# Patient Record
Sex: Female | Born: 1945 | Race: White | Hispanic: No | Marital: Married | Smoking: Former smoker
Health system: Southern US, Community
[De-identification: ages and names within clinical notes are randomized; demographics above are authoritative.]

## PROBLEM LIST (undated history)

## (undated) DIAGNOSIS — S32010A Wedge compression fracture of first lumbar vertebra, initial encounter for closed fracture: Secondary | ICD-10-CM

## (undated) DIAGNOSIS — E559 Vitamin D deficiency, unspecified: Secondary | ICD-10-CM

## (undated) DIAGNOSIS — I10 Essential (primary) hypertension: Secondary | ICD-10-CM

## (undated) DIAGNOSIS — J309 Allergic rhinitis, unspecified: Secondary | ICD-10-CM

## (undated) DIAGNOSIS — J45909 Unspecified asthma, uncomplicated: Secondary | ICD-10-CM

## (undated) DIAGNOSIS — M8580 Other specified disorders of bone density and structure, unspecified site: Secondary | ICD-10-CM

## (undated) DIAGNOSIS — J449 Chronic obstructive pulmonary disease, unspecified: Secondary | ICD-10-CM

## (undated) DIAGNOSIS — C4491 Basal cell carcinoma of skin, unspecified: Secondary | ICD-10-CM

## (undated) DIAGNOSIS — E7800 Pure hypercholesterolemia, unspecified: Secondary | ICD-10-CM

## (undated) DIAGNOSIS — J439 Emphysema, unspecified: Secondary | ICD-10-CM

## (undated) HISTORY — PX: EYE SURGERY: SHX253

## (undated) HISTORY — PX: PILONIDAL CYST EXCISION: SHX744

## (undated) HISTORY — PX: ABDOMINAL HYSTERECTOMY: SHX81

## (undated) HISTORY — PX: BUNIONECTOMY: SHX129

---

## 2006-08-03 ENCOUNTER — Inpatient Hospital Stay: Payer: Self-pay | Admitting: Internal Medicine

## 2006-08-03 ENCOUNTER — Other Ambulatory Visit: Payer: Self-pay

## 2012-06-22 ENCOUNTER — Ambulatory Visit: Payer: Self-pay | Admitting: Unknown Physician Specialty

## 2012-06-23 LAB — PATHOLOGY REPORT

## 2012-10-09 ENCOUNTER — Ambulatory Visit: Payer: Self-pay | Admitting: Internal Medicine

## 2012-10-23 ENCOUNTER — Ambulatory Visit: Payer: Self-pay | Admitting: Family Medicine

## 2012-10-23 LAB — RAPID INFLUENZA A&B ANTIGENS

## 2014-08-23 IMAGING — CR DG CHEST 2V
1 series · 2 of 2 positions shown · non-contrast
Comparison: none

REASON FOR EXAM: productive cough X 5 days
COMMENTS:

[Series 1: pa · 0.17mm/px · 2 of 2 slices shown]
[im 1/2]
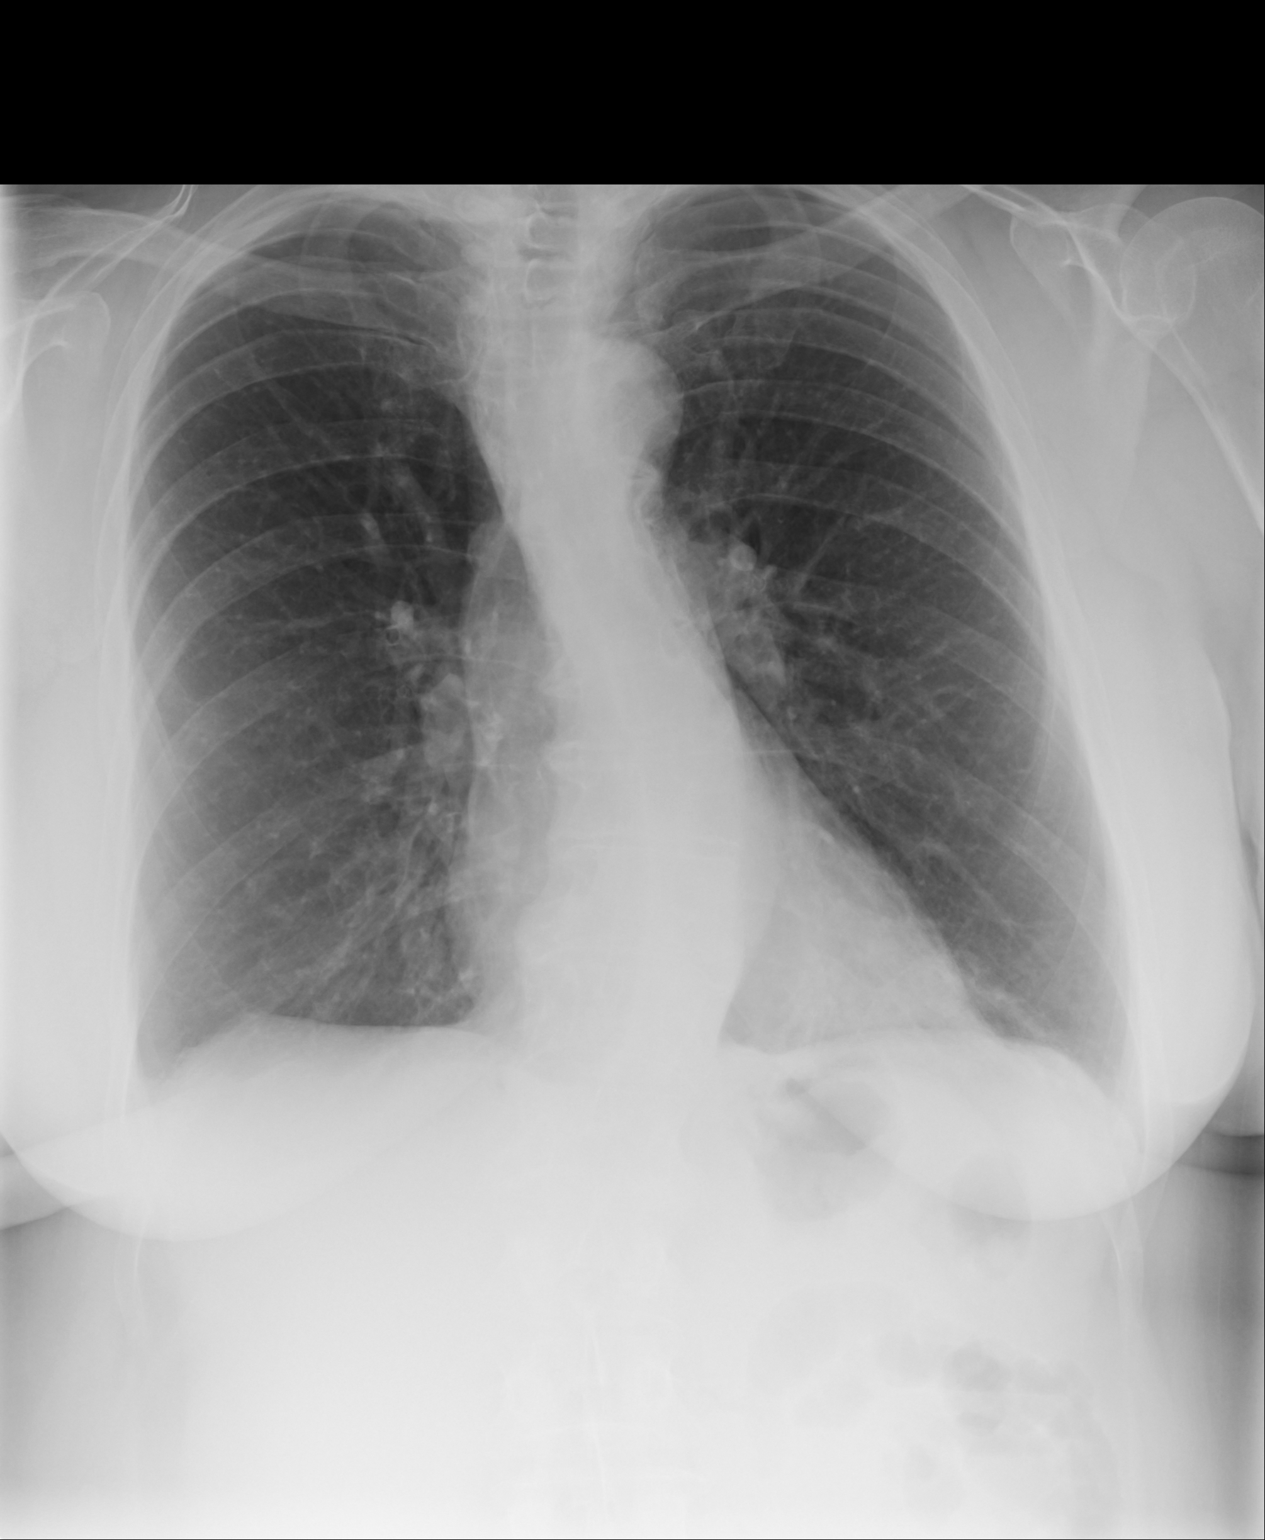
[im 2/2]
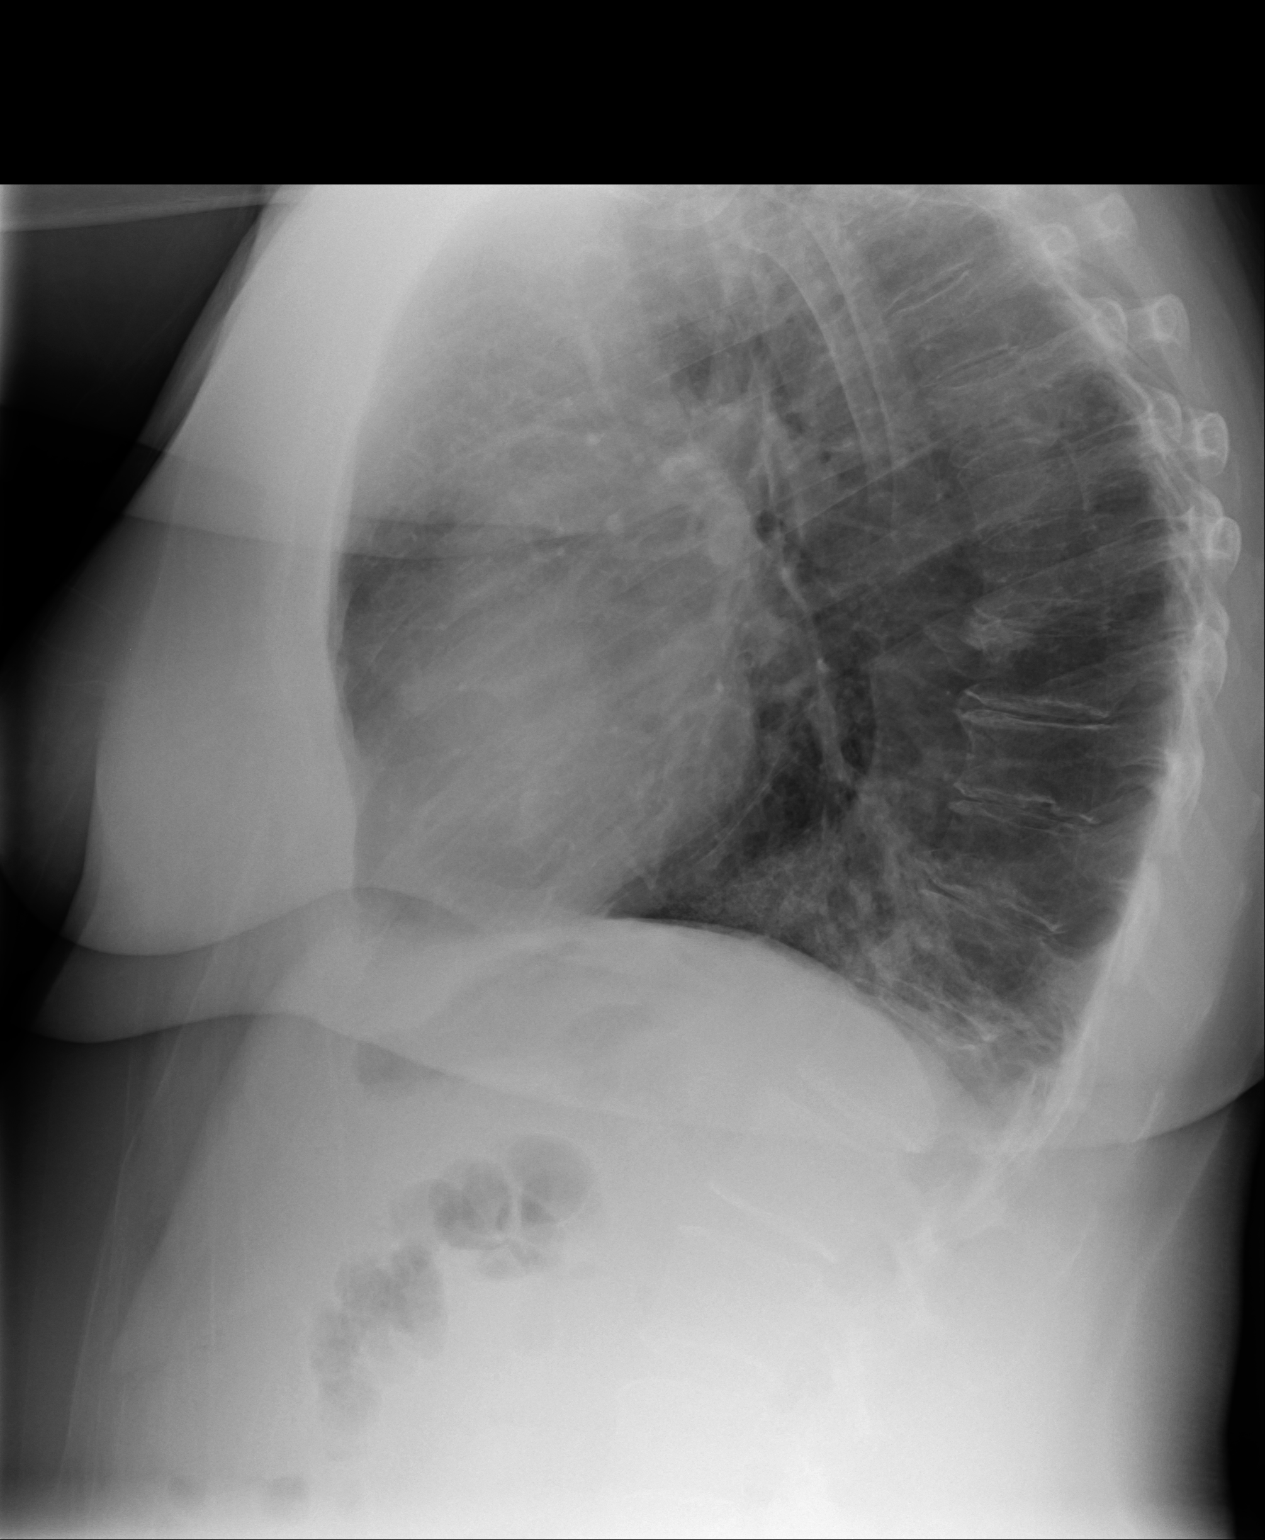

[2 of 2 positions shown; findings below may reference images not displayed]

PROCEDURE:     MDR - MDR CHEST PA(OR AP) AND LATERAL  - October 09, 2012  [DATE]

RESULT:     The lungs are mildly hyperinflated. There are increased lung
markings at the left lung base posteriorly consistent with pneumonia. The
cardiac silhouette is normal in size. The pulmonary vascularity is not
engorged. There is tortuosity of the descending thoracic aorta. There is
mild midthoracic scoliosis convex to the left.
IMPRESSION: The findings are consistent with COPD and left lower lobe
pneumonia.

[REDACTED]

## 2017-01-11 ENCOUNTER — Ambulatory Visit
Admission: EM | Admit: 2017-01-11 | Discharge: 2017-01-11 | Disposition: A | Discharge status: Home / Self Care | Payer: Medicare Other | Attending: Family Medicine | Admitting: Family Medicine

## 2017-01-11 ENCOUNTER — Encounter: Payer: Self-pay | Admitting: Gynecology

## 2017-01-11 DIAGNOSIS — R059 Cough, unspecified: Secondary | ICD-10-CM | POA: Diagnosis present

## 2017-01-11 DIAGNOSIS — R05 Cough: Secondary | ICD-10-CM | POA: Diagnosis not present

## 2017-01-11 DIAGNOSIS — J0100 Acute maxillary sinusitis, unspecified: Secondary | ICD-10-CM | POA: Diagnosis present

## 2017-01-11 HISTORY — DX: Other specified disorders of bone density and structure, unspecified site: M85.80

## 2017-01-11 HISTORY — DX: Essential (primary) hypertension: I10

## 2017-01-11 HISTORY — DX: Basal cell carcinoma of skin, unspecified: C44.91

## 2017-01-11 HISTORY — DX: Pure hypercholesterolemia, unspecified: E78.00

## 2017-01-11 HISTORY — DX: Vitamin D deficiency, unspecified: E55.9

## 2017-01-11 HISTORY — DX: Chronic obstructive pulmonary disease, unspecified: J44.9

## 2017-01-11 HISTORY — DX: Allergic rhinitis, unspecified: J30.9

## 2017-01-11 MED ORDER — AMOXICILLIN-POT CLAVULANATE 875-125 MG PO TABS: 1.0000 | ORAL_TABLET | Freq: Two times a day (BID) | ORAL | 0 refills | Status: AC

## 2017-01-11 NOTE — ED Provider Notes (Signed)
CSN: [id number]     Arrival date & time 01/11/17  0827 History   None    Chief Complaint  Patient presents with  . Facial Pain  . Cough   (Consider location/radiation/quality/duration/timing/severity/associated sxs/prior Treatment) The history is provided by the patient. No language interpreter was used.  Cough  Cough characteristics:  Productive Sputum characteristics:  Yellow Severity:  Mild Onset quality:  Sudden Timing:  Constant Progression:  Worsening Chronicity:  Recurrent Smoker: no   Context: upper respiratory infection     Past Medical History:  Diagnosis Date  . Allergic rhinitis   . Basal cell carcinoma   . COPD (chronic obstructive pulmonary disease) ([place])   . Hypercholesteremia   . Hypertension   . Osteopenia   . Vitamin D deficiency    Past Surgical History:  Procedure Laterality Date  . ABDOMINAL HYSTERECTOMY    . BUNIONECTOMY    . PILONIDAL CYST EXCISION     No family history on file. Social History  Substance Use Topics  . Smoking status: Former Smoker    Packs/day: 1.00    Types: Cigarettes    Quit date: 03/25/2007  . Smokeless tobacco: Never Used  . Alcohol use Yes   OB History    No data available     Review of Systems  HENT: Positive for congestion, sinus pain and sinus pressure.   Eyes: Negative.   Respiratory: Positive for cough.   Cardiovascular: Negative.   Gastrointestinal: Negative.   Endocrine: Negative.   Genitourinary: Negative.   Musculoskeletal: Negative.   Skin: Negative.   Allergic/Immunologic: Negative.   Psychiatric/Behavioral: Negative.   All other systems reviewed and are negative.   Allergies  Lisinopril  Home Medications   Prior to Admission medications   Medication Sig Start Date End Date Taking? Authorizing Provider  albuterol (PROVENTIL HFA;VENTOLIN HFA) 108 (90 Base) MCG/ACT inhaler Inhale into the lungs every 6 (six) hours as needed for wheezing or shortness of breath.   Yes [provider]   amLODipine (NORVASC) 10 MG tablet Take 10 mg by mouth daily.   Yes [provider]  budesonide-formoterol (SYMBICORT) 80-4.5 MCG/ACT inhaler Inhale 2 puffs into the lungs 2 (two) times daily.   Yes [provider]  cetirizine (ZYRTEC) 10 MG tablet Take 10 mg by mouth daily.   Yes [provider]  cholecalciferol (VITAMIN D) 400 units TABS tablet Take 2,000 Units by mouth.   Yes [provider]  losartan-hydrochlorothiazide (HYZAAR) 100-25 MG tablet Take 1 tablet by mouth daily.   Yes [provider]  triamcinolone (NASACORT) 55 MCG/ACT AERO nasal inhaler Place 2 sprays into the nose daily.   Yes [provider]  amoxicillin-clavulanate (AUGMENTIN) 875-125 MG tablet Take 1 tablet by mouth every 12 (twelve) hours. 7/0/78 6/75/44  Cephus Tupy, Jeanett Schlein, NP   Meds Ordered and Administered this Visit  Medications - No data to display  BP 133/73 (BP Location: Left Arm)   Pulse 92   Temp 98.3 F (36.8 C) (Oral)   Resp 16   Ht 5\' 3"  (1.6 m)   Wt 188 lb (85.3 kg)   SpO2 93%   BMI 33.30 kg/m  No data found.   Physical Exam  Constitutional: She is oriented to person, place, and time. She appears well-developed and well-nourished. She is active and cooperative. No distress.  HENT:  Head: Normocephalic.  Right Ear: Tympanic membrane is retracted.  Left Ear: Tympanic membrane is retracted.  Nose: Mucosal edema present. Right sinus exhibits maxillary sinus  tenderness. Left sinus exhibits maxillary sinus tenderness.  Mouth/Throat: Uvula is midline, oropharynx is clear and moist and mucous membranes are normal.  Eyes: Conjunctivae, EOM and lids are normal. Pupils are equal, round, and reactive to light.  Neck: Normal range of motion. No tracheal deviation present.  Cardiovascular: Regular rhythm, normal heart sounds and normal pulses.   No murmur heard. Pulmonary/Chest: Effort normal and breath sounds normal.  Abdominal: Soft. Bowel sounds are  normal. There is no tenderness.  Musculoskeletal: Normal range of motion.  Lymphadenopathy:    She has no cervical adenopathy.  Neurological: She is alert and oriented to person, place, and time. GCS eye subscore is 4. GCS verbal subscore is 5. GCS motor subscore is 6.  Skin: Skin is warm and dry. No rash noted.  Psychiatric: She has a normal mood and affect. Her speech is normal and behavior is normal.  Nursing note and vitals reviewed.   Urgent Care Course     Procedures (including critical care time)  Labs Review Labs Reviewed - No data to display  Imaging Review No results found.       MDM   1. Acute non-recurrent maxillary sinusitis   2. Cough     Take Augmentin until completed, continue home meds. Rest,push fluids. Follow up with PCP as needed. Return to  UC as needed.    Tori Milks, NP 80/22/33 [phone]

## 2017-01-11 NOTE — ED Triage Notes (Signed)
Per patient x couple days with sinus headache / persistent cough and would like an antibiotic.

## 2017-01-11 NOTE — Discharge Instructions (Signed)
Take Augmentin until completed, continue home meds. Rest,push fluids. Follow up with PCP as needed. Return to  UC as needed.

## 2017-03-18 ENCOUNTER — Other Ambulatory Visit: Payer: Self-pay | Admitting: Family Medicine

## 2017-03-18 DIAGNOSIS — M8580 Other specified disorders of bone density and structure, unspecified site: Secondary | ICD-10-CM

## 2017-03-18 DIAGNOSIS — M899 Disorder of bone, unspecified: Secondary | ICD-10-CM

## 2018-01-14 ENCOUNTER — Encounter: Payer: Self-pay | Admitting: *Deleted

## 2018-01-15 ENCOUNTER — Encounter: Payer: Self-pay | Admitting: *Deleted

## 2018-01-15 ENCOUNTER — Encounter
Admission: RE | Disposition: A | Discharge status: Home / Self Care | Payer: Self-pay | Source: Ambulatory Visit | Attending: Unknown Physician Specialty

## 2018-01-15 ENCOUNTER — Ambulatory Visit
Admission: RE | Admit: 2018-01-15 | Discharge: 2018-01-15 | Disposition: A | Discharge status: Home / Self Care | Payer: Medicare Other | Source: Ambulatory Visit | Attending: Unknown Physician Specialty | Admitting: Unknown Physician Specialty

## 2018-01-15 ENCOUNTER — Ambulatory Visit: Payer: Medicare Other | Admitting: Anesthesiology

## 2018-01-15 DIAGNOSIS — D122 Benign neoplasm of ascending colon: Secondary | ICD-10-CM | POA: Diagnosis not present

## 2018-01-15 DIAGNOSIS — Z85828 Personal history of other malignant neoplasm of skin: Secondary | ICD-10-CM | POA: Insufficient documentation

## 2018-01-15 DIAGNOSIS — I10 Essential (primary) hypertension: Secondary | ICD-10-CM | POA: Diagnosis not present

## 2018-01-15 DIAGNOSIS — M8580 Other specified disorders of bone density and structure, unspecified site: Secondary | ICD-10-CM | POA: Diagnosis not present

## 2018-01-15 DIAGNOSIS — K635 Polyp of colon: Secondary | ICD-10-CM | POA: Insufficient documentation

## 2018-01-15 DIAGNOSIS — Z86010 Personal history of colonic polyps: Secondary | ICD-10-CM | POA: Diagnosis present

## 2018-01-15 DIAGNOSIS — D127 Benign neoplasm of rectosigmoid junction: Secondary | ICD-10-CM | POA: Insufficient documentation

## 2018-01-15 DIAGNOSIS — Z888 Allergy status to other drugs, medicaments and biological substances status: Secondary | ICD-10-CM | POA: Diagnosis not present

## 2018-01-15 DIAGNOSIS — Z7951 Long term (current) use of inhaled steroids: Secondary | ICD-10-CM | POA: Diagnosis not present

## 2018-01-15 DIAGNOSIS — Z87891 Personal history of nicotine dependence: Secondary | ICD-10-CM | POA: Diagnosis not present

## 2018-01-15 DIAGNOSIS — E7800 Pure hypercholesterolemia, unspecified: Secondary | ICD-10-CM | POA: Insufficient documentation

## 2018-01-15 DIAGNOSIS — Z79899 Other long term (current) drug therapy: Secondary | ICD-10-CM | POA: Insufficient documentation

## 2018-01-15 DIAGNOSIS — E559 Vitamin D deficiency, unspecified: Secondary | ICD-10-CM | POA: Diagnosis not present

## 2018-01-15 DIAGNOSIS — Z1211 Encounter for screening for malignant neoplasm of colon: Secondary | ICD-10-CM | POA: Insufficient documentation

## 2018-01-15 DIAGNOSIS — J449 Chronic obstructive pulmonary disease, unspecified: Secondary | ICD-10-CM | POA: Diagnosis not present

## 2018-01-15 HISTORY — PX: COLONOSCOPY WITH PROPOFOL: SHX5780

## 2018-01-15 SURGERY — COLONOSCOPY WITH PROPOFOL
Anesthesia: General

## 2018-01-15 MED ORDER — PROPOFOL 500 MG/50ML IV EMUL
INTRAVENOUS | Status: DC | PRN
  Administered 2018-01-15: 125 ug/kg/min via INTRAVENOUS

## 2018-01-15 MED ORDER — PROPOFOL 10 MG/ML IV BOLUS
INTRAVENOUS | Status: DC | PRN
  Administered 2018-01-15: 20 mg via INTRAVENOUS
  Administered 2018-01-15: 50 mg via INTRAVENOUS
  Administered 2018-01-15: 20 mg via INTRAVENOUS
  Administered 2018-01-15: 30 mg via INTRAVENOUS

## 2018-01-15 MED ORDER — SODIUM CHLORIDE 0.9 % IV SOLN
INTRAVENOUS | Status: DC
  Administered 2018-01-15: 1000 mL via INTRAVENOUS

## 2018-01-15 MED ORDER — PROPOFOL 500 MG/50ML IV EMUL
INTRAVENOUS | Status: AC
  Filled 2018-01-15: qty 50

## 2018-01-15 MED ORDER — SODIUM CHLORIDE 0.9 % IV SOLN
INTRAVENOUS | Status: DC
  Administered 2018-01-15: 11:00:00 via INTRAVENOUS

## 2018-01-15 NOTE — Anesthesia Post-op Follow-up Note (Signed)
Anesthesia QCDR form completed.        

## 2018-01-15 NOTE — Transfer of Care (Signed)
Immediate Anesthesia Transfer of Care Note  Patient: Emily Lawrence  Procedure(s) Performed: COLONOSCOPY WITH PROPOFOL (N/A )  Patient Location: PACU and Endoscopy Unit  Anesthesia Type:General  Level of Consciousness: awake, alert  and oriented  Airway & Oxygen Therapy: Patient Spontanous Breathing and Patient connected to nasal cannula oxygen  Post-op Assessment: Report given to RN and Post -op Vital signs reviewed and stable  Post vital signs: Reviewed and stable  Last Vitals:  Vitals Value Taken Time  BP    Temp    Pulse    Resp    SpO2      Last Pain: There were no vitals filed for this visit.       Complications: No apparent anesthesia complications

## 2018-01-15 NOTE — Op Note (Signed)
[facility] Gastroenterology Patient Name: Emily Lawrence Procedure Date: 01/15/2018 11:05 AM MRN: [id number] Account #: [id number] Date of Birth: [birth year] Admit Type: Outpatient Age: 72 Room: [facility] ENDO ROOM 1 Gender: Female Note Status: Finalized Procedure:            Colonoscopy Indications:          High risk colon cancer surveillance: Personal history                        of colonic polyps Providers:            Manya Silvas, MD Referring MD:         Gayland Curry MD, MD (Referring MD) Medicines:            Propofol per Anesthesia Complications:        No immediate complications. Procedure:            Pre-Anesthesia Assessment:                       - After reviewing the risks and benefits, the patient                        was deemed in satisfactory condition to undergo the                        procedure.                       After obtaining informed consent, the colonoscope was                        passed under direct vision. Throughout the procedure,                        the patient's blood pressure, pulse, and oxygen                        saturations were monitored continuously. The                        Colonoscope was introduced through the anus and                        advanced to the the cecum, identified by appendiceal                        orifice and ileocecal valve. The colonoscopy was                        somewhat difficult due to a tortuous colon. Successful                        completion of the procedure was aided by applying                        abdominal pressure. The patient tolerated the procedure                        well. Findings:      A small polyp was found in the proximal ascending colon. The polyp was  sessile. The polyp was removed with a hot snare. Resection and retrieval       were complete.      A diminutive polyp was found in the sigmoid colon. The polyp was       sessile. The polyp was removed  with a jumbo cold forceps. Resection and       retrieval were complete.      A diminutive polyp was found in the recto-sigmoid colon. The polyp was       sessile. The polyp was removed with a jumbo cold forceps. Resection and       retrieval were complete.      A small polyp was found in the recto-sigmoid colon. The polyp was       sessile. The polyp was removed with a hot snare. Resection and retrieval       were complete.      Four sessile polyps were found in the recto-sigmoid colon. The polyps       were diminutive in size. These polyps were removed with a jumbo cold       forceps. Resection and retrieval were complete. Impression:           - One small polyp in the proximal ascending colon,                        removed with a hot snare. Resected and retrieved.                       - One diminutive polyp in the sigmoid colon, removed                        with a jumbo cold forceps. Resected and retrieved.                       - One diminutive polyp at the recto-sigmoid colon,                        removed with a jumbo cold forceps. Resected and                        retrieved.                       - One small polyp at the recto-sigmoid colon, removed                        with a hot snare. Resected and retrieved.                       - Four diminutive polyps at the recto-sigmoid colon,                        removed with a jumbo cold forceps. Resected and                        retrieved. Recommendation:       - Await pathology results. Manya Silvas, MD 01/15/2018 12:02:49 PM This report has been signed electronically. Number of Addenda: 0 Note Initiated On: 01/15/2018 11:05 AM Scope Withdrawal Time: 0 hours 26 minutes 54 seconds  Total Procedure Duration: 0 hours 39 minutes 13 seconds  [facility]

## 2018-01-15 NOTE — H&P (Signed)
Primary Care Physician:  Gayland Curry, MD Primary Gastroenterologist:  Dr. Vira Agar  Pre-Procedure History & Physical: HPI:  Emily Lawrence is a 72 y.o. female is here for an colonoscopy.  Done for personal history of colon polyps.   Past Medical History:  Diagnosis Date  . Allergic rhinitis   . Basal cell carcinoma   . COPD (chronic obstructive pulmonary disease) ([place])   . Hypercholesteremia   . Hypertension   . Osteopenia   . Vitamin D deficiency     Past Surgical History:  Procedure Laterality Date  . ABDOMINAL HYSTERECTOMY    . BUNIONECTOMY    . PILONIDAL CYST EXCISION      Prior to Admission medications   Medication Sig Start Date End Date Taking? Authorizing Provider  albuterol (PROVENTIL HFA;VENTOLIN HFA) 108 (90 Base) MCG/ACT inhaler Inhale into the lungs every 6 (six) hours as needed for wheezing or shortness of breath.   Yes [provider]  amLODipine (NORVASC) 10 MG tablet Take 10 mg by mouth daily.   Yes [provider]  budesonide-formoterol (SYMBICORT) 80-4.5 MCG/ACT inhaler Inhale 2 puffs into the lungs 2 (two) times daily.   Yes [provider]  cetirizine (ZYRTEC) 10 MG tablet Take 10 mg by mouth daily.   Yes [provider]  cholecalciferol (VITAMIN D) 400 units TABS tablet Take 2,000 Units by mouth.   Yes [provider]  losartan-hydrochlorothiazide (HYZAAR) 100-25 MG tablet Take 1 tablet by mouth daily.   Yes [provider]  triamcinolone (NASACORT) 55 MCG/ACT AERO nasal inhaler Place 2 sprays into the nose daily.   Yes [provider]    Allergies as of 11/04/2017 - Review Complete 01/11/2017  Allergen Reaction Noted  . Lisinopril Cough 01/11/2017    History reviewed. No pertinent family history.  Social History   Socioeconomic History  . Marital status: Married    Spouse name: Not on file  . Number of children: Not on file  . Years of education: Not on file  . Highest  education level: Not on file  Occupational History  . Not on file  Social Needs  . Financial resource strain: Not on file  . Food insecurity:    Worry: Not on file    Inability: Not on file  . Transportation needs:    Medical: Not on file    Non-medical: Not on file  Tobacco Use  . Smoking status: Former Smoker    Packs/day: 1.00    Types: Cigarettes    Last attempt to quit: 03/25/2007    Years since quitting: 10.8  . Smokeless tobacco: Never Used  Substance and Sexual Activity  . Alcohol use: Yes  . Drug use: No  . Sexual activity: Not on file  Lifestyle  . Physical activity:    Days per week: Not on file    Minutes per session: Not on file  . Stress: Not on file  Relationships  . Social connections:    Talks on phone: Not on file    Gets together: Not on file    Attends religious service: Not on file    Active member of club or organization: Not on file    Attends meetings of clubs or organizations: Not on file    Relationship status: Not on file  . Intimate partner violence:    Fear of current or ex partner: Not on file    Emotionally abused: Not on file    Physically abused: Not on file  Forced sexual activity: Not on file  Other Topics Concern  . Not on file  Social History Narrative  . Not on file    Review of Systems: See HPI, otherwise negative ROS  Physical Exam: There were no vitals taken for this visit. General:   Alert,  pleasant and cooperative in NAD Head:  Normocephalic and atraumatic. Neck:  Supple; no masses or thyromegaly. Lungs:  Clear throughout to auscultation.    Heart:  Regular rate and rhythm. Abdomen:  Soft, nontender and nondistended. Normal bowel sounds, without guarding, and without rebound.   Neurologic:  Alert and  oriented x4;  grossly normal neurologically.  Impression/Plan: Emily Lawrence is here for an colonoscopy to be performed for [facility] colon polyps.  Risks, benefits, limitations, and alternatives regarding  colonoscopy  have been reviewed with the patient.  Questions have been answered.  All parties agreeable.   Gaylyn Cheers, MD  01/15/2018, 11:10 AM

## 2018-01-15 NOTE — Anesthesia Preprocedure Evaluation (Signed)
Anesthesia Evaluation  Patient identified by MRN, date of birth, ID band Patient awake    Reviewed: Allergy & Precautions, H&P , NPO status , Patient's Chart, lab work & pertinent test results, reviewed documented beta blocker date and time   Airway Mallampati: III  TM Distance: >3 FB Neck ROM: full    Dental  (+) Caps, Dental Advidsory Given, Teeth Intact   Pulmonary neg shortness of breath, COPD, neg recent URI, former smoker,           Cardiovascular Exercise Tolerance: Good hypertension, (-) angina(-) CAD, (-) Past MI, (-) Cardiac Stents and (-) CABG (-) dysrhythmias (-) Valvular Problems/Murmurs     Neuro/Psych negative neurological ROS  negative psych ROS   GI/Hepatic negative GI ROS, Neg liver ROS,   Endo/Other  negative endocrine ROS  Renal/GU negative Renal ROS  negative genitourinary   Musculoskeletal   Abdominal   Peds  Hematology negative hematology ROS (+)   Anesthesia Other Findings Past Medical History: No date: Allergic rhinitis No date: Basal cell carcinoma No date: COPD (chronic obstructive pulmonary disease) ([facility]) No date: Hypercholesteremia No date: Hypertension No date: Osteopenia No date: Vitamin D deficiency   Reproductive/Obstetrics negative OB ROS                             Anesthesia Physical Anesthesia Plan  ASA: II  Anesthesia Plan: General   Post-op Pain Management:    Induction: Intravenous  PONV Risk Score and Plan: 3 and Propofol infusion and TIVA  Airway Management Planned: Nasal Cannula  Additional Equipment:   Intra-op Plan:   Post-operative Plan:   Informed Consent: I have reviewed the patients History and Physical, chart, labs and discussed the procedure including the risks, benefits and alternatives for the proposed anesthesia with the patient or authorized representative who has indicated his/her understanding and acceptance.    Dental Advisory Given  Plan Discussed with: Anesthesiologist, CRNA and Surgeon  Anesthesia Plan Comments:         Anesthesia Quick Evaluation

## 2018-01-16 NOTE — Anesthesia Postprocedure Evaluation (Signed)
Anesthesia Post Note  Patient: Emily Lawrence  Procedure(s) Performed: COLONOSCOPY WITH PROPOFOL (N/A )  Patient location during evaluation: Endoscopy Anesthesia Type: General Level of consciousness: awake and alert Pain management: pain level controlled Vital Signs Assessment: post-procedure vital signs reviewed and stable Respiratory status: spontaneous breathing, nonlabored ventilation, respiratory function stable and patient connected to nasal cannula oxygen Cardiovascular status: blood pressure returned to baseline and stable Postop Assessment: no apparent nausea or vomiting Anesthetic complications: no     Last Vitals:  Vitals:   01/15/18 1220 01/15/18 1230  BP: 125/76 128/78  Pulse: 72 72  Resp: 18 (!) 21  Temp:    SpO2: 99% 98%    Last Pain:  Vitals:   01/15/18 1230  TempSrc:   PainSc: 0-No pain                 Martha Clan

## 2018-01-17 ENCOUNTER — Encounter: Payer: Self-pay | Admitting: Unknown Physician Specialty

## 2018-01-19 LAB — SURGICAL PATHOLOGY

## 2019-12-21 ENCOUNTER — Other Ambulatory Visit: Payer: Self-pay | Admitting: Family Medicine

## 2019-12-21 DIAGNOSIS — Z1231 Encounter for screening mammogram for malignant neoplasm of breast: Secondary | ICD-10-CM

## 2020-01-04 ENCOUNTER — Other Ambulatory Visit: Payer: Self-pay | Admitting: Family Medicine

## 2020-01-04 DIAGNOSIS — Z122 Encounter for screening for malignant neoplasm of respiratory organs: Secondary | ICD-10-CM

## 2020-01-04 DIAGNOSIS — Z87891 Personal history of nicotine dependence: Secondary | ICD-10-CM

## 2020-01-19 ENCOUNTER — Telehealth: Payer: Self-pay | Admitting: *Deleted

## 2020-01-19 NOTE — Telephone Encounter (Signed)
Received referral for low dose lung cancer screening CT scan. Message left at phone number listed in EMR for patient to call me back to facilitate scheduling scan.  

## 2020-01-24 ENCOUNTER — Telehealth: Payer: Self-pay | Admitting: *Deleted

## 2020-01-24 DIAGNOSIS — Z87891 Personal history of nicotine dependence: Secondary | ICD-10-CM

## 2020-01-24 DIAGNOSIS — Z122 Encounter for screening for malignant neoplasm of respiratory organs: Secondary | ICD-10-CM

## 2020-01-24 NOTE — Telephone Encounter (Signed)
Received referral for initial lung cancer screening scan. Contacted patient and obtained smoking history,(former, quit 14 years ago, 32 pack year) as well as answering questions related to screening process. Patient denies signs of lung cancer such as weight loss or hemoptysis. Patient denies comorbidity that would prevent curative treatment if lung cancer were found. Patient is scheduled for shared decision making visit and CT scan on 03/06/20 at 115pm.

## 2020-03-06 ENCOUNTER — Inpatient Hospital Stay: Payer: Medicare Other | Attending: Nurse Practitioner | Admitting: Nurse Practitioner

## 2020-03-06 ENCOUNTER — Other Ambulatory Visit: Payer: Self-pay

## 2020-03-06 ENCOUNTER — Ambulatory Visit
Admission: RE | Admit: 2020-03-06 | Discharge: 2020-03-06 | Disposition: A | Discharge status: Home / Self Care | Payer: Medicare Other | Source: Ambulatory Visit | Attending: Nurse Practitioner | Admitting: Nurse Practitioner

## 2020-03-06 DIAGNOSIS — Z122 Encounter for screening for malignant neoplasm of respiratory organs: Secondary | ICD-10-CM | POA: Diagnosis present

## 2020-03-06 DIAGNOSIS — Z87891 Personal history of nicotine dependence: Secondary | ICD-10-CM

## 2020-03-06 NOTE — Progress Notes (Signed)
Virtual Visit via Video Enabled Telemedicine Note   I connected with Emily Lawrence on 03/06/20 at 1:15 PM EST by video enabled telemedicine visit and verified that I am speaking with the correct person using two identifiers.   I discussed the limitations, risks, security and privacy concerns of performing an evaluation and management service by telemedicine and the availability of in-person appointments. I also discussed with the patient that there may be a patient responsible charge related to this service. The patient expressed understanding and agreed to proceed.   Other persons participating in the visit and their role in the encounter: Burgess Estelle, RN- checking in patient & navigation  Patient's location: [place]  Provider's location: Clinic  Chief Complaint: Low Dose CT Screening  Patient agreed to evaluation by telemedicine to discuss shared decision making for consideration of low dose CT lung cancer screening.    In accordance with CMS guidelines, patient has met eligibility criteria including age, absence of signs or symptoms of lung cancer.  Social History   Tobacco Use  . Smoking status: Former Smoker    Packs/day: 1.00    Types: Cigarettes    Quit date: 03/25/2007    Years since quitting: 12.9  . Smokeless tobacco: Never Used  Substance Use Topics  . Alcohol use: Yes     A shared decision-making session was conducted prior to the performance of CT scan. This includes one or more decision aids, includes benefits and harms of screening, follow-up diagnostic testing, over-diagnosis, false positive rate, and total radiation exposure.   Counseling on the importance of adherence to annual lung cancer LDCT screening, impact of co-morbidities, and ability or willingness to undergo diagnosis and treatment is imperative for compliance of the program.   Counseling on the importance of continued smoking cessation for former smokers; the importance of smoking cessation for  current smokers, and information about tobacco cessation interventions have been given to patient including [place] and [address] [employer] [place] programs.   Written order for lung cancer screening with LDCT has been given to the patient and any and all questions have been answered to the best of my abilities.    Yearly follow up will be coordinated by Burgess Estelle, Thoracic Navigator.  I discussed the assessment and treatment plan with the patient. The patient was provided an opportunity to ask questions and all were answered. The patient agreed with the plan and demonstrated an understanding of the instructions.   The patient was advised to call back or seek an in-person evaluation if the symptoms worsen or if the condition fails to improve as anticipated.   I provided 15 minutes of face-to-face video visit time during this encounter, and > 50% was spent counseling as documented under my assessment & plan.   Beckey Rutter, DNP, AGNP-C [place] [facility] [phone] (clinic)

## 2020-03-15 ENCOUNTER — Telehealth: Payer: Self-pay | Admitting: *Deleted

## 2020-03-15 NOTE — Telephone Encounter (Signed)
Notified patient of LDCT lung cancer screening program results with recommendation for 6 month follow up imaging. Also notified of incidental findings noted below and is encouraged to discuss further with PCP who will receive a copy of this note and/or the CT report. Patient verbalizes understanding.   IMPRESSION: 1. Lung-RADS 3, probably benign findings. Short-term follow-up in 6 months is recommended with repeat low-dose chest CT without contrast (please use the following order, "CT CHEST LCS NODULE FOLLOW-UP W/O CM"). Mild bronchiectasis with bronchial wall thickening and scattered airway impaction in the right lower lobe has associated small nodules measuring up to 6.5 mm. This is probably related to atypical infection although in the appropriate clinical setting, aspiration could have a similar appearance. Consider therapy prior to follow-up CT. 2. Small to moderate hiatal hernia. 3. Emphysema (ICD10-J43.9).

## 2020-07-16 ENCOUNTER — Other Ambulatory Visit (HOSPITAL_COMMUNITY): Payer: Self-pay | Admitting: Specialist

## 2020-07-16 ENCOUNTER — Other Ambulatory Visit: Payer: Self-pay | Admitting: Specialist

## 2020-07-16 DIAGNOSIS — R9389 Abnormal findings on diagnostic imaging of other specified body structures: Secondary | ICD-10-CM

## 2020-07-16 DIAGNOSIS — R918 Other nonspecific abnormal finding of lung field: Secondary | ICD-10-CM

## 2020-09-04 ENCOUNTER — Ambulatory Visit
Admission: RE | Admit: 2020-09-04 | Discharge: 2020-09-04 | Disposition: A | Discharge status: Home / Self Care | Payer: Medicare Other | Source: Ambulatory Visit | Attending: Specialist | Admitting: Specialist

## 2020-09-04 ENCOUNTER — Other Ambulatory Visit: Payer: Self-pay | Admitting: Specialist

## 2020-09-04 ENCOUNTER — Other Ambulatory Visit: Payer: Self-pay

## 2020-09-04 DIAGNOSIS — R918 Other nonspecific abnormal finding of lung field: Secondary | ICD-10-CM | POA: Diagnosis present

## 2020-09-04 DIAGNOSIS — R9389 Abnormal findings on diagnostic imaging of other specified body structures: Secondary | ICD-10-CM

## 2020-09-04 DIAGNOSIS — Z87891 Personal history of nicotine dependence: Secondary | ICD-10-CM | POA: Insufficient documentation

## 2020-09-11 ENCOUNTER — Other Ambulatory Visit: Payer: Self-pay | Admitting: Family Medicine

## 2020-09-11 DIAGNOSIS — R109 Unspecified abdominal pain: Secondary | ICD-10-CM

## 2020-09-13 ENCOUNTER — Ambulatory Visit: Payer: Medicare Other

## 2020-12-06 ENCOUNTER — Encounter: Payer: Self-pay | Admitting: Ophthalmology

## 2020-12-10 ENCOUNTER — Telehealth: Payer: Self-pay | Admitting: *Deleted

## 2020-12-10 ENCOUNTER — Other Ambulatory Visit: Payer: Self-pay | Admitting: *Deleted

## 2020-12-10 DIAGNOSIS — Z87891 Personal history of nicotine dependence: Secondary | ICD-10-CM

## 2020-12-10 DIAGNOSIS — R918 Other nonspecific abnormal finding of lung field: Secondary | ICD-10-CM

## 2020-12-10 NOTE — Telephone Encounter (Signed)
Attempted to contact and schedule lung screening scan follow up imaging. Message left for patient to call back to schedule.

## 2020-12-10 NOTE — Progress Notes (Signed)
Contacted and scheduled for LCS nodule follow up. Former smoker, quit 15 years ago, 32 pack year. Delay in follow up per patient request.

## 2020-12-18 ENCOUNTER — Ambulatory Visit: Payer: Medicare Other | Admitting: Anesthesiology

## 2020-12-18 ENCOUNTER — Ambulatory Visit
Admission: RE | Admit: 2020-12-18 | Discharge: 2020-12-18 | Disposition: A | Discharge status: Home / Self Care | Payer: Medicare Other | Attending: Ophthalmology | Admitting: Ophthalmology

## 2020-12-18 ENCOUNTER — Other Ambulatory Visit: Payer: Self-pay

## 2020-12-18 ENCOUNTER — Encounter
Admission: RE | Disposition: A | Discharge status: Home / Self Care | Payer: Self-pay | Source: Home / Self Care | Attending: Ophthalmology

## 2020-12-18 DIAGNOSIS — Z7951 Long term (current) use of inhaled steroids: Secondary | ICD-10-CM | POA: Insufficient documentation

## 2020-12-18 DIAGNOSIS — H2512 Age-related nuclear cataract, left eye: Secondary | ICD-10-CM | POA: Diagnosis present

## 2020-12-18 DIAGNOSIS — Z90710 Acquired absence of both cervix and uterus: Secondary | ICD-10-CM | POA: Diagnosis not present

## 2020-12-18 DIAGNOSIS — Z888 Allergy status to other drugs, medicaments and biological substances status: Secondary | ICD-10-CM | POA: Insufficient documentation

## 2020-12-18 DIAGNOSIS — Z85828 Personal history of other malignant neoplasm of skin: Secondary | ICD-10-CM | POA: Diagnosis not present

## 2020-12-18 DIAGNOSIS — Z87891 Personal history of nicotine dependence: Secondary | ICD-10-CM | POA: Insufficient documentation

## 2020-12-18 DIAGNOSIS — Z79899 Other long term (current) drug therapy: Secondary | ICD-10-CM | POA: Insufficient documentation

## 2020-12-18 HISTORY — PX: CATARACT EXTRACTION W/PHACO: SHX586

## 2020-12-18 SURGERY — PHACOEMULSIFICATION, CATARACT, WITH IOL INSERTION
Anesthesia: Monitor Anesthesia Care | Site: Eye | Laterality: Left

## 2020-12-18 MED ORDER — MIDAZOLAM HCL 2 MG/2ML IJ SOLN
INTRAMUSCULAR | Status: DC | PRN
  Administered 2020-12-18: 2 mg via INTRAVENOUS

## 2020-12-18 MED ORDER — LIDOCAINE HCL (PF) 2 % IJ SOLN
INTRAOCULAR | Status: DC | PRN
  Administered 2020-12-18: 1 mL via INTRAMUSCULAR

## 2020-12-18 MED ORDER — MOXIFLOXACIN HCL 0.5 % OP SOLN
OPHTHALMIC | Status: DC | PRN
  Administered 2020-12-18: 0.2 mL via OPHTHALMIC

## 2020-12-18 MED ORDER — ARMC OPHTHALMIC DILATING DROPS
1.0000 "application " | OPHTHALMIC | Status: DC | PRN
  Administered 2020-12-18 (×3): 1 via OPHTHALMIC

## 2020-12-18 MED ORDER — BRIMONIDINE TARTRATE-TIMOLOL 0.2-0.5 % OP SOLN
OPHTHALMIC | Status: DC | PRN
  Administered 2020-12-18: 1 [drp] via OPHTHALMIC

## 2020-12-18 MED ORDER — TETRACAINE HCL 0.5 % OP SOLN
1.0000 [drp] | OPHTHALMIC | Status: DC | PRN
  Administered 2020-12-18 (×3): 1 [drp] via OPHTHALMIC

## 2020-12-18 MED ORDER — FENTANYL CITRATE (PF) 100 MCG/2ML IJ SOLN
INTRAMUSCULAR | Status: DC | PRN
  Administered 2020-12-18: 50 ug via INTRAVENOUS

## 2020-12-18 MED ORDER — EPINEPHRINE PF 1 MG/ML IJ SOLN
INTRAOCULAR | Status: DC | PRN
  Administered 2020-12-18: 66 mL via OPHTHALMIC

## 2020-12-18 MED ORDER — LACTATED RINGERS IV SOLN: INTRAVENOUS | Status: DC

## 2020-12-18 MED ORDER — NA CHONDROIT SULF-NA HYALURON 40-17 MG/ML IO SOLN
INTRAOCULAR | Status: DC | PRN
  Administered 2020-12-18: 1 mL via INTRAOCULAR

## 2020-12-18 SURGICAL SUPPLY — 17 items
CANNULA ANT/CHMB 27GA (MISCELLANEOUS) ×6 IMPLANT
GLOVE SURG TRIUMPH 8.0 PF LTX (GLOVE) ×3 IMPLANT
GOWN STRL REUS W/ TWL LRG LVL3 (GOWN DISPOSABLE) ×2 IMPLANT
GOWN STRL REUS W/TWL LRG LVL3 (GOWN DISPOSABLE) ×6
LENS IOL TECNIS EYHANCE 20.5 (Intraocular Lens) ×3 IMPLANT
MARKER SKIN DUAL TIP RULER LAB (MISCELLANEOUS) ×3 IMPLANT
NEEDLE FILTER BLUNT 18X 1/2SAF (NEEDLE) ×2
NEEDLE FILTER BLUNT 18X1 1/2 (NEEDLE) ×1 IMPLANT
PACK EYE AFTER SURG (MISCELLANEOUS) ×3 IMPLANT
PACK OPTHALMIC (MISCELLANEOUS) ×3 IMPLANT
PACK PORFILIO (MISCELLANEOUS) ×3 IMPLANT
SUT ETHILON 10-0 CS-B-6CS-B-6 (SUTURE)
SUTURE EHLN 10-0 CS-B-6CS-B-6 (SUTURE) IMPLANT
SYR 3ML LL SCALE MARK (SYRINGE) ×3 IMPLANT
SYR TB 1ML LUER SLIP (SYRINGE) ×3 IMPLANT
WATER STERILE IRR 250ML POUR (IV SOLUTION) ×3 IMPLANT
WIPE NON LINTING 3.25X3.25 (MISCELLANEOUS) ×3 IMPLANT

## 2020-12-18 NOTE — Anesthesia Procedure Notes (Signed)
Procedure Name: MAC Date/Time: 12/18/2020 11:38 AM Performed by: Mayme Genta, CRNA Pre-anesthesia Checklist: Patient identified, Emergency Drugs available, Suction available, Timeout performed and Patient being monitored Patient Re-evaluated:Patient Re-evaluated prior to induction Oxygen Delivery Method: Nasal cannula Placement Confirmation: positive ETCO2

## 2020-12-18 NOTE — Op Note (Signed)
PREOPERATIVE DIAGNOSIS:  Nuclear sclerotic cataract of the left eye.   POSTOPERATIVE DIAGNOSIS:  Nuclear sclerotic cataract of the left eye.   OPERATIVE PROCEDURE:ORPROCALL@   SURGEON:  Birder Robson, MD.   ANESTHESIA:  Anesthesiologist: Alisa Graff, MD CRNA: Mayme Genta, CRNA  1.      Managed anesthesia care. 2.     0.71ml of Shugarcaine was instilled following the paracentesis   COMPLICATIONS:  None.   TECHNIQUE:   Stop and chop   DESCRIPTION OF PROCEDURE:  The patient was examined and consented in the preoperative holding area where the aforementioned topical anesthesia was applied to the left eye and then brought back to the Operating Room where the left eye was prepped and draped in the usual sterile ophthalmic fashion and a lid speculum was placed. A paracentesis was created with the side port blade and the anterior chamber was filled with viscoelastic. A near clear corneal incision was performed with the steel keratome. A continuous curvilinear capsulorrhexis was performed with a cystotome followed by the capsulorrhexis forceps. Hydrodissection and hydrodelineation were carried out with BSS on a blunt cannula. The lens was removed in a stop and chop  technique and the remaining cortical material was removed with the irrigation-aspiration handpiece. The capsular bag was inflated with viscoelastic and the Technis [id number] lens was placed in the capsular bag without complication. The remaining viscoelastic was removed from the eye with the irrigation-aspiration handpiece. The wounds were hydrated. The anterior chamber was flushed with BSS and the eye was inflated to physiologic pressure. 0.41ml Vigamox was placed in the anterior chamber. The wounds were found to be water tight. The eye was dressed with Combigan. The patient was given protective glasses to wear throughout the day and a shield with which to sleep tonight. The patient was also given drops with which to begin a drop regimen  today and will follow-up with me in one day. Implant Name Type Inv. Item Serial No. Manufacturer Lot No. LRB No. Used Action  LENS IOL TECNIS EYHANCE 20.5 - [id number] Intraocular Lens LENS IOL TECNIS EYHANCE 20.5 [id number] JOHNSON   Left 1 Implanted    Procedure(s) with comments: CATARACT EXTRACTION PHACO AND INTRAOCULAR LENS PLACEMENT (IOC) LEFT (Left) - 12.95 01:09.0  Electronically signed: Birder Robson 12/18/2020 11:50 AM

## 2020-12-18 NOTE — Anesthesia Postprocedure Evaluation (Signed)
Anesthesia Post Note  Patient: Emily Lawrence  Procedure(s) Performed: CATARACT EXTRACTION PHACO AND INTRAOCULAR LENS PLACEMENT (IOC) LEFT (Left: Eye)     Patient location during evaluation: PACU Anesthesia Type: MAC Level of consciousness: awake and alert Pain management: pain level controlled Vital Signs Assessment: post-procedure vital signs reviewed and stable Respiratory status: spontaneous breathing, nonlabored ventilation, respiratory function stable and patient connected to nasal cannula oxygen Cardiovascular status: stable and blood pressure returned to baseline Postop Assessment: no apparent nausea or vomiting Anesthetic complications: no   No notable events documented.  Alisa Graff

## 2020-12-18 NOTE — H&P (Signed)
[facility]   Primary Care Physician:  Gayland Curry, MD Ophthalmologist: Dr. George Ina  Pre-Procedure History & Physical: HPI:  Emily Lawrence is a 75 y.o. female here for cataract surgery.   Past Medical History:  Diagnosis Date   Allergic rhinitis    Basal cell carcinoma    COPD (chronic obstructive pulmonary disease) ([place])    Hypercholesteremia    Hypertension    Osteopenia    Vitamin D deficiency     Past Surgical History:  Procedure Laterality Date   ABDOMINAL HYSTERECTOMY     BUNIONECTOMY     COLONOSCOPY WITH PROPOFOL N/A 01/15/2018   Procedure: COLONOSCOPY WITH PROPOFOL;  Surgeon: Manya Silvas, MD;  Location: [facility] ENDOSCOPY;  Service: Endoscopy;  Laterality: N/A;   PILONIDAL CYST EXCISION      Prior to Admission medications   Medication Sig Start Date End Date Taking? Authorizing Provider  albuterol (PROVENTIL HFA;VENTOLIN HFA) 108 (90 Base) MCG/ACT inhaler Inhale into the lungs every 6 (six) hours as needed for wheezing or shortness of breath.   Yes [provider]  amLODipine (NORVASC) 10 MG tablet Take 10 mg by mouth daily.   Yes [provider]  atorvastatin (LIPITOR) 10 MG tablet Take 10 mg by mouth daily.   Yes [provider]  budesonide-formoterol (SYMBICORT) 80-4.5 MCG/ACT inhaler Inhale 2 puffs into the lungs 2 (two) times daily.   Yes [provider]  Carboxymethylcellulose Sodium (THERATEARS OP) Apply to eye 2 (two) times daily as needed.   Yes [provider]  cetirizine (ZYRTEC) 10 MG tablet Take 10 mg by mouth daily.   Yes [provider]  cholecalciferol (VITAMIN D) 400 units TABS tablet Take 2,000 Units by mouth.   Yes [provider]  hydrochlorothiazide (HYDRODIURIL) 25 MG tablet Take 25 mg by mouth daily.   Yes [provider]  losartan (COZAAR) 100 MG tablet Take 100 mg by mouth daily.   Yes [provider]  triamcinolone (NASACORT) 55 MCG/ACT AERO nasal  inhaler Place 2 sprays into the nose daily.   Yes [provider]    Allergies as of 11/12/2020 - Review Complete 01/15/2018  Allergen Reaction Noted   Lisinopril Cough 01/11/2017    History reviewed. No pertinent family history.  Social History   Socioeconomic History   Marital status: Married    Spouse name: Not on file   Number of children: Not on file   Years of education: Not on file   Highest education level: Not on file  Occupational History   Not on file  Tobacco Use   Smoking status: Former    Packs/day: 1.00    Pack years: 0.00    Types: Cigarettes    Quit date: 03/25/2007    Years since quitting: 13.7   Smokeless tobacco: Never  Vaping Use   Vaping Use: Never used  Substance and Sexual Activity   Alcohol use: Yes    Comment: occasional   Drug use: No   Sexual activity: Not on file  Other Topics Concern   Not on file  Social History Narrative   Not on file   Social Determinants of Health   Financial Resource Strain: Not on file  Food Insecurity: Not on file  Transportation Needs: Not on file  Physical Activity: Not on file  Stress: Not on file  Social Connections: Not on file  Intimate Partner Violence: Not on file    Review of Systems: See HPI, otherwise negative ROS  Physical  Exam: BP (!) 145/89   Pulse 89   Temp 98.3 F (36.8 C) (Temporal)   Resp 16   Ht 5\' 5"  (1.651 m)   Wt 88 kg   SpO2 97%   BMI 32.28 kg/m  General:   Alert,  pleasant and cooperative in NAD Head:  Normocephalic and atraumatic. Respiratory:  Normal work of breathing. Cardiovascular:  RRR  Impression/Plan: Emily Lawrence is here for cataract surgery.  Risks, benefits, limitations, and alternatives regarding cataract surgery have been reviewed with the patient.  Questions have been answered.  All parties agreeable.   Birder Robson, MD  12/18/2020, 11:28 AM

## 2020-12-18 NOTE — Anesthesia Preprocedure Evaluation (Signed)
Anesthesia Evaluation  Patient identified by MRN, date of birth, ID band Patient awake    Reviewed: Allergy & Precautions, H&P , NPO status , Patient's Chart, lab work & pertinent test results, reviewed documented beta blocker date and time   Airway Mallampati: II  TM Distance: >3 FB Neck ROM: full    Dental no notable dental hx.    Pulmonary COPD, former smoker,    Pulmonary exam normal breath sounds clear to auscultation       Cardiovascular Exercise Tolerance: Good hypertension, negative cardio ROS   Rhythm:regular Rate:Normal     Neuro/Psych negative neurological ROS  negative psych ROS   GI/Hepatic negative GI ROS, Neg liver ROS,   Endo/Other  negative endocrine ROS  Renal/GU negative Renal ROS  negative genitourinary   Musculoskeletal   Abdominal   Peds  Hematology negative hematology ROS (+)   Anesthesia Other Findings   Reproductive/Obstetrics negative OB ROS                             Anesthesia Physical Anesthesia Plan  ASA: 2  Anesthesia Plan: MAC   Post-op Pain Management:    Induction:   PONV Risk Score and Plan: 2 and Treatment may vary due to age or medical condition  Airway Management Planned:   Additional Equipment:   Intra-op Plan:   Post-operative Plan:   Informed Consent: I have reviewed the patients History and Physical, chart, labs and discussed the procedure including the risks, benefits and alternatives for the proposed anesthesia with the patient or authorized representative who has indicated his/her understanding and acceptance.     Dental Advisory Given  Plan Discussed with: CRNA  Anesthesia Plan Comments:         Anesthesia Quick Evaluation

## 2020-12-18 NOTE — Transfer of Care (Signed)
Immediate Anesthesia Transfer of Care Note  Patient: Emily Lawrence  Procedure(s) Performed: CATARACT EXTRACTION PHACO AND INTRAOCULAR LENS PLACEMENT (IOC) LEFT (Left: Eye)  Patient Location: PACU  Anesthesia Type: MAC  Level of Consciousness: awake, alert  and patient cooperative  Airway and Oxygen Therapy: Patient Spontanous Breathing and Patient connected to supplemental oxygen  Post-op Assessment: Post-op Vital signs reviewed, Patient's Cardiovascular Status Stable, Respiratory Function Stable, Patent Airway and No signs of Nausea or vomiting  Post-op Vital Signs: Reviewed and stable  Complications: No notable events documented.

## 2020-12-19 ENCOUNTER — Other Ambulatory Visit: Payer: Self-pay

## 2020-12-19 ENCOUNTER — Encounter: Payer: Self-pay | Admitting: Ophthalmology

## 2020-12-25 NOTE — Anesthesia Preprocedure Evaluation (Addendum)
Anesthesia Evaluation  Patient identified by MRN, date of birth, ID band Patient awake    Reviewed: Allergy & Precautions, NPO status , Patient's Chart, lab work & pertinent test results  History of Anesthesia Complications Negative for: history of anesthetic complications  Airway Mallampati: IV   Neck ROM: Full    Dental no notable dental hx.    Pulmonary COPD, former smoker (quit 2008),    Pulmonary exam normal breath sounds clear to auscultation       Cardiovascular hypertension, Normal cardiovascular exam Rhythm:Regular Rate:Normal     Neuro/Psych    GI/Hepatic negative GI ROS,   Endo/Other  Obesity   Renal/GU negative Renal ROS     Musculoskeletal   Abdominal   Peds  Hematology Skin BCC   Anesthesia Other Findings   Reproductive/Obstetrics                            Anesthesia Physical Anesthesia Plan  ASA: 2  Anesthesia Plan: MAC   Post-op Pain Management:    Induction: Intravenous  PONV Risk Score and Plan: 2 and TIVA, Treatment may vary due to age or medical condition and Midazolam  Airway Management Planned: Nasal Cannula  Additional Equipment:   Intra-op Plan:   Post-operative Plan:   Informed Consent: I have reviewed the patients History and Physical, chart, labs and discussed the procedure including the risks, benefits and alternatives for the proposed anesthesia with the patient or authorized representative who has indicated his/her understanding and acceptance.       Plan Discussed with: CRNA  Anesthesia Plan Comments:        Anesthesia Quick Evaluation

## 2021-01-01 ENCOUNTER — Ambulatory Visit: Payer: Medicare Other | Admitting: Anesthesiology

## 2021-01-01 ENCOUNTER — Ambulatory Visit
Admission: RE | Admit: 2021-01-01 | Discharge: 2021-01-01 | Disposition: A | Discharge status: Home / Self Care | Payer: Medicare Other | Attending: Ophthalmology | Admitting: Ophthalmology

## 2021-01-01 ENCOUNTER — Other Ambulatory Visit: Payer: Self-pay

## 2021-01-01 ENCOUNTER — Encounter: Payer: Self-pay | Admitting: Ophthalmology

## 2021-01-01 ENCOUNTER — Encounter
Admission: RE | Disposition: A | Discharge status: Home / Self Care | Payer: Self-pay | Source: Home / Self Care | Attending: Ophthalmology

## 2021-01-01 DIAGNOSIS — Z85828 Personal history of other malignant neoplasm of skin: Secondary | ICD-10-CM | POA: Diagnosis not present

## 2021-01-01 DIAGNOSIS — Z888 Allergy status to other drugs, medicaments and biological substances status: Secondary | ICD-10-CM | POA: Diagnosis not present

## 2021-01-01 DIAGNOSIS — Z79899 Other long term (current) drug therapy: Secondary | ICD-10-CM | POA: Diagnosis not present

## 2021-01-01 DIAGNOSIS — Z87891 Personal history of nicotine dependence: Secondary | ICD-10-CM | POA: Insufficient documentation

## 2021-01-01 DIAGNOSIS — Z7951 Long term (current) use of inhaled steroids: Secondary | ICD-10-CM | POA: Insufficient documentation

## 2021-01-01 DIAGNOSIS — Z961 Presence of intraocular lens: Secondary | ICD-10-CM | POA: Diagnosis not present

## 2021-01-01 DIAGNOSIS — H2511 Age-related nuclear cataract, right eye: Secondary | ICD-10-CM | POA: Insufficient documentation

## 2021-01-01 DIAGNOSIS — Z9842 Cataract extraction status, left eye: Secondary | ICD-10-CM | POA: Insufficient documentation

## 2021-01-01 HISTORY — PX: CATARACT EXTRACTION W/PHACO: SHX586

## 2021-01-01 SURGERY — PHACOEMULSIFICATION, CATARACT, WITH IOL INSERTION
Anesthesia: Monitor Anesthesia Care | Site: Eye | Laterality: Right

## 2021-01-01 MED ORDER — TETRACAINE HCL 0.5 % OP SOLN
1.0000 [drp] | OPHTHALMIC | Status: DC | PRN
  Administered 2021-01-01 (×3): 1 [drp] via OPHTHALMIC

## 2021-01-01 MED ORDER — MIDAZOLAM HCL 2 MG/2ML IJ SOLN
INTRAMUSCULAR | Status: DC | PRN
  Administered 2021-01-01 (×2): 1 mg via INTRAVENOUS

## 2021-01-01 MED ORDER — ACETAMINOPHEN 325 MG PO TABS: 650.0000 mg | ORAL_TABLET | Freq: Once | ORAL | Status: DC | PRN

## 2021-01-01 MED ORDER — SIGHTPATH DOSE#1 NA CHONDROIT SULF-NA HYALURON 40-17 MG/ML IO SOLN
INTRAOCULAR | Status: DC | PRN
  Administered 2021-01-01: 1 mL via INTRAOCULAR

## 2021-01-01 MED ORDER — PHENYLEPHRINE HCL 10 % OP SOLN
1.0000 [drp] | OPHTHALMIC | Status: AC
  Administered 2021-01-01 (×3): 1 [drp] via OPHTHALMIC

## 2021-01-01 MED ORDER — LACTATED RINGERS IV SOLN: INTRAVENOUS | Status: DC

## 2021-01-01 MED ORDER — ACETAMINOPHEN 160 MG/5ML PO SOLN: 325.0000 mg | ORAL | Status: DC | PRN

## 2021-01-01 MED ORDER — MOXIFLOXACIN HCL 0.5 % OP SOLN
OPHTHALMIC | Status: DC | PRN
  Administered 2021-01-01: 0.2 mL via OPHTHALMIC

## 2021-01-01 MED ORDER — ONDANSETRON HCL 4 MG/2ML IJ SOLN
4.0000 mg | Freq: Once | INTRAMUSCULAR | Status: DC | PRN
Start: 2021-01-01 — End: 2021-01-01

## 2021-01-01 MED ORDER — FENTANYL CITRATE (PF) 100 MCG/2ML IJ SOLN
INTRAMUSCULAR | Status: DC | PRN
  Administered 2021-01-01: 50 ug via INTRAVENOUS

## 2021-01-01 MED ORDER — LIDOCAINE HCL (PF) 2 % IJ SOLN
INTRAOCULAR | Status: DC | PRN
  Administered 2021-01-01: 1 mL

## 2021-01-01 MED ORDER — BRIMONIDINE TARTRATE-TIMOLOL 0.2-0.5 % OP SOLN
OPHTHALMIC | Status: DC | PRN
  Administered 2021-01-01: 1 [drp] via OPHTHALMIC

## 2021-01-01 MED ORDER — CYCLOPENTOLATE HCL 2 % OP SOLN
1.0000 [drp] | OPHTHALMIC | Status: AC
  Administered 2021-01-01 (×3): 1 [drp] via OPHTHALMIC

## 2021-01-01 MED ORDER — SIGHTPATH DOSE#1 BSS IO SOLN
INTRAOCULAR | Status: DC | PRN
  Administered 2021-01-01: 58 mL via OPHTHALMIC

## 2021-01-01 SURGICAL SUPPLY — 15 items
CANNULA ANT/CHMB 27GA (MISCELLANEOUS) ×6 IMPLANT
GLOVE SURG ENC TEXT LTX SZ8 (GLOVE) ×3 IMPLANT
GLOVE SURG TRIUMPH 8.0 PF LTX (GLOVE) ×3 IMPLANT
GOWN STRL REUS W/ TWL LRG LVL3 (GOWN DISPOSABLE) ×2 IMPLANT
GOWN STRL REUS W/TWL LRG LVL3 (GOWN DISPOSABLE) ×6
LENS IOL TECNIS EYHANCE 19.5 (Intraocular Lens) ×3 IMPLANT
MARKER SKIN DUAL TIP RULER LAB (MISCELLANEOUS) ×3 IMPLANT
NEEDLE FILTER BLUNT 18X 1/2SAF (NEEDLE) ×2
NEEDLE FILTER BLUNT 18X1 1/2 (NEEDLE) ×1 IMPLANT
PACK EYE AFTER SURG (MISCELLANEOUS) ×3 IMPLANT
SYR 3ML LL SCALE MARK (SYRINGE) ×3 IMPLANT
SYR TB 1ML LUER SLIP (SYRINGE) ×3 IMPLANT
TIP ITREPID SGL USE BENT I/A (SUCTIONS) ×3 IMPLANT
WATER STERILE IRR 250ML POUR (IV SOLUTION) ×3 IMPLANT
WIPE NON LINTING 3.25X3.25 (MISCELLANEOUS) ×3 IMPLANT

## 2021-01-01 NOTE — H&P (Signed)
[facility]   Primary Care Physician:  Gayland Curry, MD Ophthalmologist: Dr. Benay Pillow  Pre-Procedure History & Physical: HPI:  Emily Lawrence is a 75 y.o. female here for cataract surgery.   Past Medical History:  Diagnosis Date   Allergic rhinitis    Basal cell carcinoma    COPD (chronic obstructive pulmonary disease) ([facility])    Hypercholesteremia    Hypertension    Osteopenia    Vitamin D deficiency     Past Surgical History:  Procedure Laterality Date   ABDOMINAL HYSTERECTOMY     BUNIONECTOMY     CATARACT EXTRACTION W/PHACO Left 12/18/2020   Procedure: CATARACT EXTRACTION PHACO AND INTRAOCULAR LENS PLACEMENT ([place]) LEFT;  Surgeon: Birder Robson, MD;  Location: [place];  Service: Ophthalmology;  Laterality: Left;  12.95 01:09.0   COLONOSCOPY WITH PROPOFOL N/A 01/15/2018   Procedure: COLONOSCOPY WITH PROPOFOL;  Surgeon: Manya Silvas, MD;  Location: [facility] ENDOSCOPY;  Service: Endoscopy;  Laterality: N/A;   PILONIDAL CYST EXCISION      Prior to Admission medications   Medication Sig Start Date End Date Taking? Authorizing Provider  albuterol (PROVENTIL HFA;VENTOLIN HFA) 108 (90 Base) MCG/ACT inhaler Inhale into the lungs every 6 (six) hours as needed for wheezing or shortness of breath.   Yes [provider]  amLODipine (NORVASC) 10 MG tablet Take 10 mg by mouth daily.   Yes [provider]  atorvastatin (LIPITOR) 10 MG tablet Take 10 mg by mouth daily.   Yes [provider]  budesonide-formoterol (SYMBICORT) 80-4.5 MCG/ACT inhaler Inhale 2 puffs into the lungs 2 (two) times daily.   Yes [provider]  Carboxymethylcellulose Sodium (THERATEARS OP) Apply to eye 2 (two) times daily as needed.   Yes [provider]  cetirizine (ZYRTEC) 10 MG tablet Take 10 mg by mouth daily.   Yes [provider]  cholecalciferol (VITAMIN D) 400 units TABS tablet Take 2,000 Units by mouth.   Yes [provider]  hydrochlorothiazide (HYDRODIURIL) 25 MG tablet Take 25 mg by mouth daily.   Yes [provider]  losartan (COZAAR) 100 MG tablet Take 100 mg by mouth daily.   Yes [provider]  triamcinolone (NASACORT) 55 MCG/ACT AERO nasal inhaler Place 2 sprays into the nose daily.   Yes [provider]    Allergies as of 11/12/2020 - Review Complete 01/15/2018  Allergen Reaction Noted   Lisinopril Cough 01/11/2017    History reviewed. No pertinent family history.  Social History   Socioeconomic History   Marital status: Married    Spouse name: Not on file   Number of children: Not on file   Years of education: Not on file   Highest education level: Not on file  Occupational History   Not on file  Tobacco Use   Smoking status: Former    Packs/day: 1.00    Pack years: 0.00    Types: Cigarettes    Quit date: 03/25/2007    Years since quitting: 13.7   Smokeless tobacco: Never  Vaping Use   Vaping Use: Never used  Substance and Sexual Activity   Alcohol use: Yes    Comment: occasional   Drug use: No   Sexual activity: Not on file  Other Topics Concern   Not on file  Social History Narrative   Not on file   Social Determinants of Health   Financial Resource Strain: Not on file  Food Insecurity: Not on file  Transportation Needs: Not  on file  Physical Activity: Not on file  Stress: Not on file  Social Connections: Not on file  Intimate Partner Violence: Not on file    Review of Systems: See HPI, otherwise negative ROS  Physical Exam: BP (!) 143/80   Pulse 87   Temp (!) 97 F (36.1 C) (Temporal)   Resp 16   Ht 5\' 5"  (1.651 m)   Wt 88 kg   SpO2 96%   BMI 32.28 kg/m  General:   Alert,  pleasant and cooperative in NAD Head:  Normocephalic and atraumatic. Respiratory:  Normal work of breathing. Cardiovascular:  RRR  Impression/Plan: Emily Lawrence is here for cataract surgery.  Risks, benefits, limitations, and alternatives  regarding cataract surgery have been reviewed with the patient.  Questions have been answered.  All parties agreeable.   Birder Robson, MD  01/01/2021, 12:39 PM

## 2021-01-01 NOTE — Transfer of Care (Signed)
Immediate Anesthesia Transfer of Care Note  Patient: Emily Lawrence  Procedure(s) Performed: CATARACT EXTRACTION PHACO AND INTRAOCULAR LENS PLACEMENT (IOC) RIGHT 16.14 01:19.1 (Right: Eye)  Patient Location: PACU  Anesthesia Type: MAC  Level of Consciousness: awake, alert  and patient cooperative  Airway and Oxygen Therapy: Patient Spontanous Breathing and Patient connected to supplemental oxygen  Post-op Assessment: Post-op Vital signs reviewed, Patient's Cardiovascular Status Stable, Respiratory Function Stable, Patent Airway and No signs of Nausea or vomiting  Post-op Vital Signs: Reviewed and stable  Complications: No notable events documented.

## 2021-01-01 NOTE — Anesthesia Postprocedure Evaluation (Signed)
Anesthesia Post Note  Patient: Emily Lawrence  Procedure(s) Performed: CATARACT EXTRACTION PHACO AND INTRAOCULAR LENS PLACEMENT (IOC) RIGHT 16.14 01:19.1 (Right: Eye)     Patient location during evaluation: PACU Anesthesia Type: MAC Level of consciousness: awake and alert, oriented and patient cooperative Pain management: pain level controlled Vital Signs Assessment: post-procedure vital signs reviewed and stable Respiratory status: spontaneous breathing, nonlabored ventilation and respiratory function stable Cardiovascular status: blood pressure returned to baseline and stable Postop Assessment: adequate PO intake Anesthetic complications: no   No notable events documented.  Darrin Nipper

## 2021-01-01 NOTE — Op Note (Signed)
PREOPERATIVE DIAGNOSIS:  Nuclear sclerotic cataract of the right eye.   POSTOPERATIVE DIAGNOSIS:  H25.11 Cataract   OPERATIVE PROCEDURE:ORPROCALL@   SURGEON:  Birder Robson, MD.   ANESTHESIA:  Anesthesiologist: Darrin Nipper, MD CRNA: Cameron Ali, CRNA  1.      Managed anesthesia care. 2.      0.86ml of Shugarcaine was instilled in the eye following the paracentesis.   COMPLICATIONS:  None.   TECHNIQUE:   Stop and chop   DESCRIPTION OF PROCEDURE:  The patient was examined and consented in the preoperative holding area where the aforementioned topical anesthesia was applied to the right eye and then brought back to the Operating Room where the right eye was prepped and draped in the usual sterile ophthalmic fashion and a lid speculum was placed. A paracentesis was created with the side port blade and the anterior chamber was filled with viscoelastic. A near clear corneal incision was performed with the steel keratome. A continuous curvilinear capsulorrhexis was performed with a cystotome followed by the capsulorrhexis forceps. Hydrodissection and hydrodelineation were carried out with BSS on a blunt cannula. The lens was removed in a stop and chop  technique and the remaining cortical material was removed with the irrigation-aspiration handpiece. The capsular bag was inflated with viscoelastic and the Technis [id number]  lens was placed in the capsular bag without complication. The remaining viscoelastic was removed from the eye with the irrigation-aspiration handpiece. The wounds were hydrated. The anterior chamber was flushed with BSS and the eye was inflated to physiologic pressure. 0.77ml of Vigamox was placed in the anterior chamber. The wounds were found to be water tight. The eye was dressed with Combigan. The patient was given protective glasses to wear throughout the day and a shield with which to sleep tonight. The patient was also given drops with which to begin a drop regimen today and  will follow-up with me in one day. Implant Name Type Inv. Item Serial No. Manufacturer Lot No. LRB No. Used Action  LENS IOL TECNIS EYHANCE 19.5 - [id number] Intraocular Lens LENS IOL TECNIS EYHANCE 19.5 [id number] JOH[employer]ON   Right 1 Implanted   Procedure(s): CATARACT EXTRACTION PHACO AND INTRAOCULAR LENS PLACEMENT (IOC) RIGHT 16.14 01:19.1 (Right)  Electronically signed: Birder Robson 01/01/2021 1:05 PM

## 2021-01-01 NOTE — Anesthesia Procedure Notes (Signed)
Procedure Name: MAC Date/Time: 01/01/2021 12:48 PM Performed by: Cameron Ali, CRNA Pre-anesthesia Checklist: Patient identified, Emergency Drugs available, Suction available, Timeout performed and Patient being monitored Patient Re-evaluated:Patient Re-evaluated prior to induction Oxygen Delivery Method: Nasal cannula Placement Confirmation: positive ETCO2

## 2021-01-09 ENCOUNTER — Encounter: Payer: Self-pay | Admitting: Ophthalmology

## 2021-01-17 ENCOUNTER — Ambulatory Visit
Admission: RE | Admit: 2021-01-17 | Discharge: 2021-01-17 | Disposition: A | Discharge status: Home / Self Care | Payer: Medicare Other | Source: Ambulatory Visit | Attending: Nurse Practitioner | Admitting: Nurse Practitioner

## 2021-01-17 ENCOUNTER — Other Ambulatory Visit: Payer: Self-pay

## 2021-01-17 DIAGNOSIS — R918 Other nonspecific abnormal finding of lung field: Secondary | ICD-10-CM | POA: Diagnosis present

## 2021-01-17 DIAGNOSIS — Z87891 Personal history of nicotine dependence: Secondary | ICD-10-CM | POA: Diagnosis not present

## 2022-04-07 ENCOUNTER — Other Ambulatory Visit: Payer: Self-pay | Admitting: Family Medicine

## 2022-04-07 DIAGNOSIS — Z9181 History of falling: Secondary | ICD-10-CM

## 2022-04-07 DIAGNOSIS — S32000S Wedge compression fracture of unspecified lumbar vertebra, sequela: Secondary | ICD-10-CM

## 2022-04-15 ENCOUNTER — Ambulatory Visit
Admission: RE | Admit: 2022-04-15 | Discharge: 2022-04-15 | Disposition: A | Discharge status: Home / Self Care | Payer: Medicare Other | Source: Ambulatory Visit | Attending: Family Medicine | Admitting: Family Medicine

## 2022-04-15 DIAGNOSIS — Z9181 History of falling: Secondary | ICD-10-CM | POA: Insufficient documentation

## 2022-04-15 DIAGNOSIS — S32000S Wedge compression fracture of unspecified lumbar vertebra, sequela: Secondary | ICD-10-CM | POA: Diagnosis present

## 2022-12-01 IMAGING — CT CT CHEST LCS NODULE FOLLOW-UP W/O CM
2 of 5 series · 15 of 40 positions shown, 18 images · non-contrast
Comparison: Low-dose lung cancer screening CT chest dated
09/04/2020

CLINICAL DATA: 75-year-old female former smoker, quit 15 years ago,
with 32 pack-year history of smoking, for short-term follow-up lung
cancer screening

EXAM:
CT CHEST WITHOUT CONTRAST FOR LUNG CANCER SCREENING NODULE FOLLOW-UP
TECHNIQUE: Multidetector CT imaging of the chest was performed following the
standard protocol without IV contrast.

[Series 3: lung lcs f/u 1.00 · axial · 0.65mm/px · z∈[-1197,-905]mm · 12 of 322 slices shown, 15 images]
[im 15/322  mediastinal]
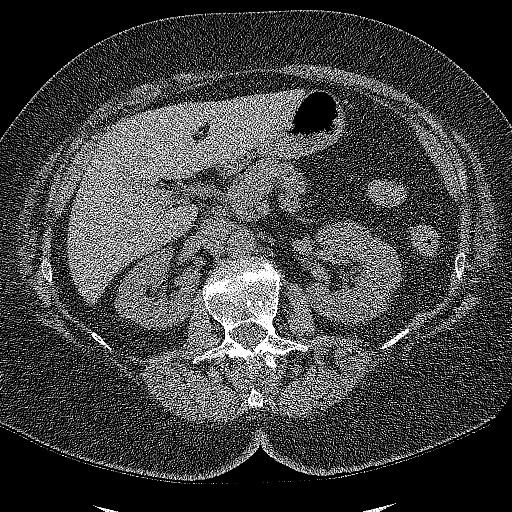
[im 15/322  lung]
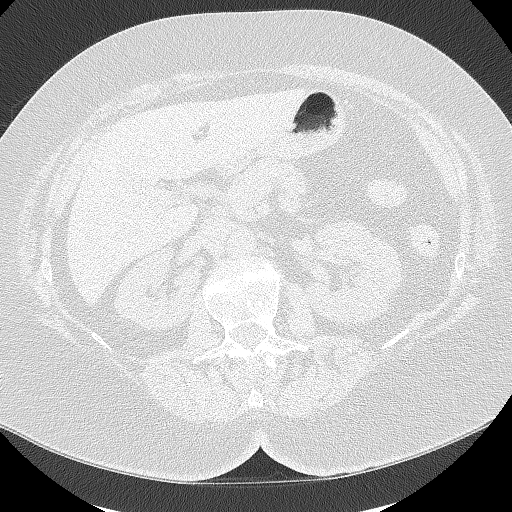
[im 44/322  lung]
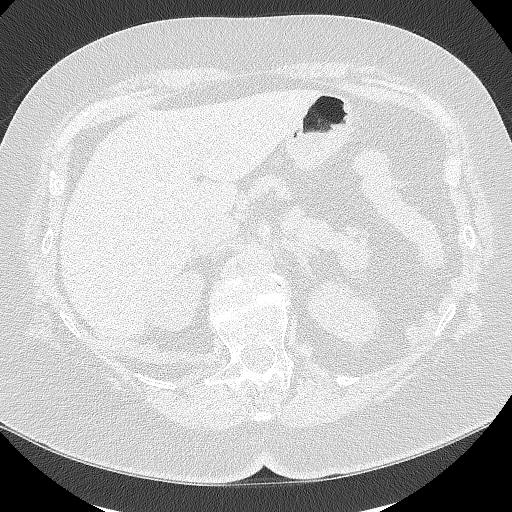
[im 73/322  lung]
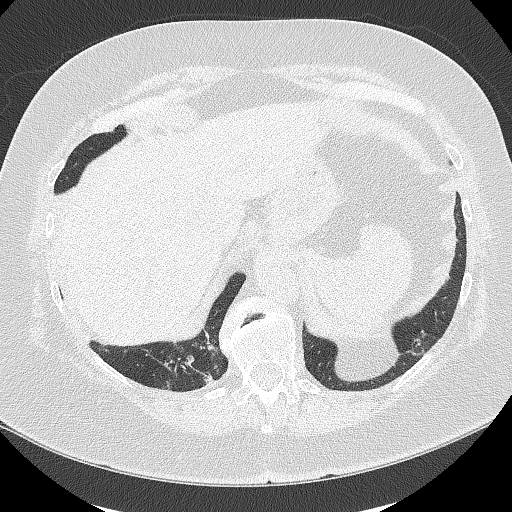
[im 103/322  lung]
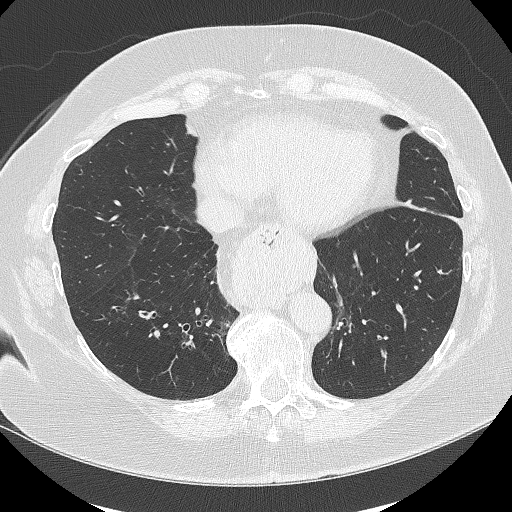
[im 117/322  mediastinal]
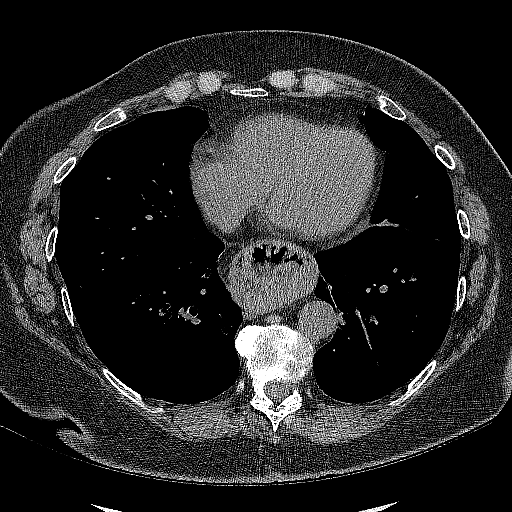
[im 117/322  lung]
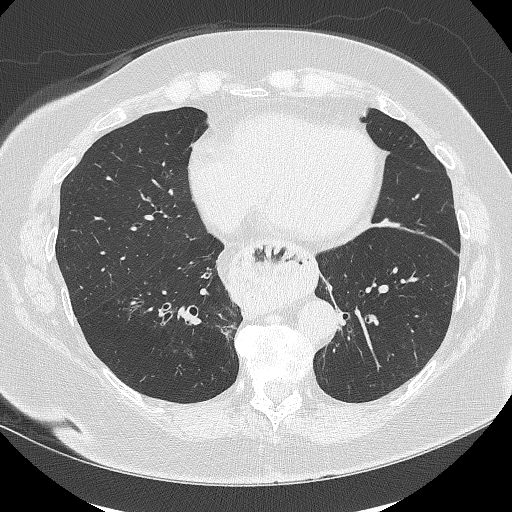
[im 146/322  lung]
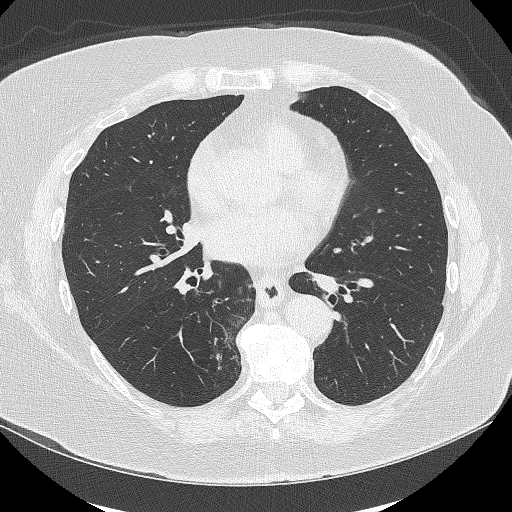
[im 176/322  lung]
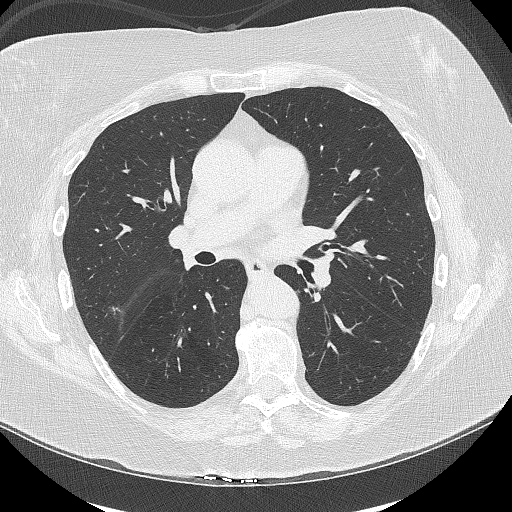
[im 205/322  lung]
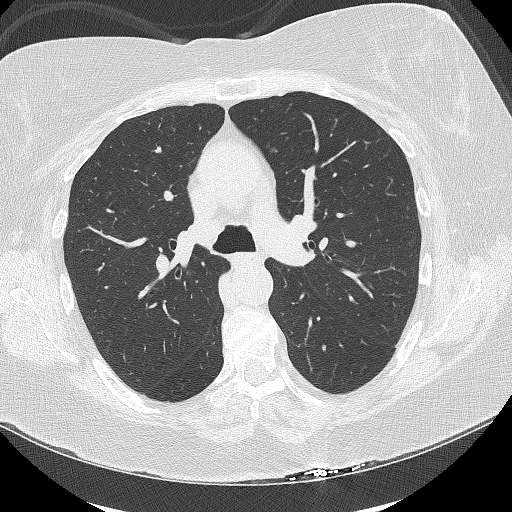
[im 219/322  mediastinal]
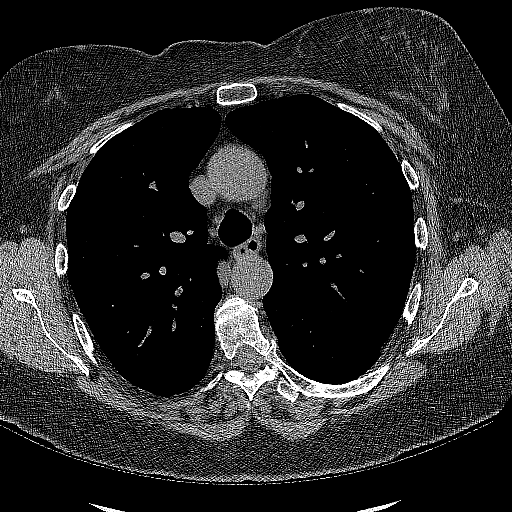
[im 219/322  lung]
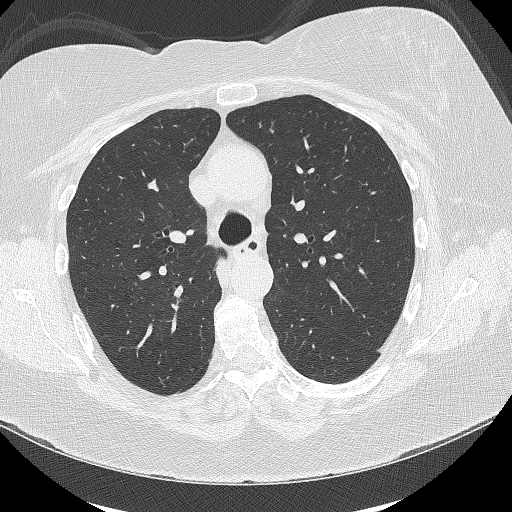
[im 249/322  lung]
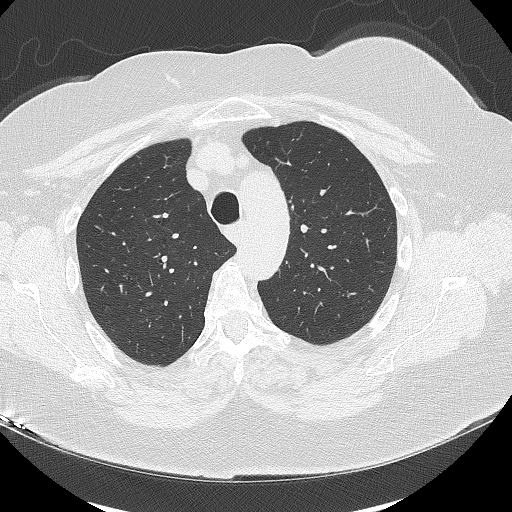
[im 278/322  lung]
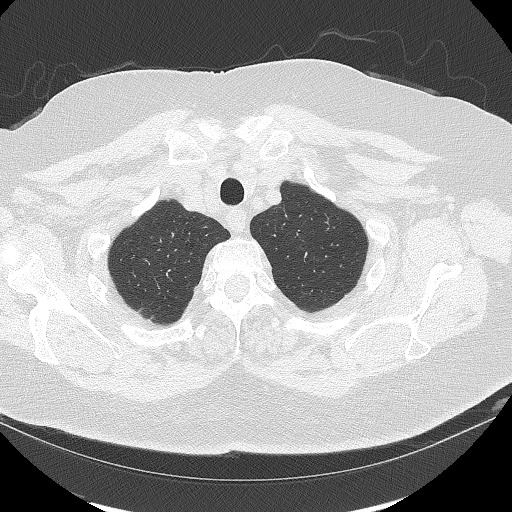
[im 307/322  lung]
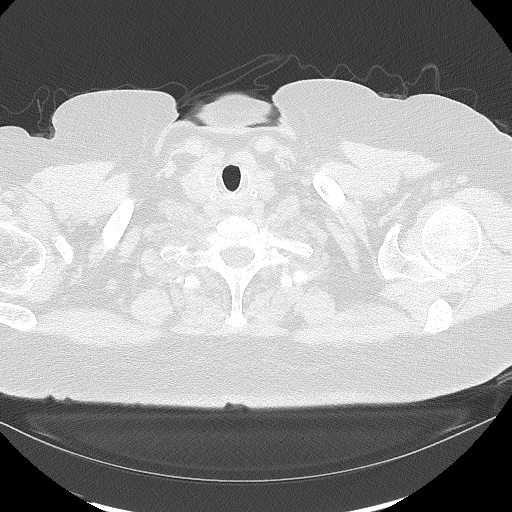

[Series 5: lcs f/u 1.00 cor · coronal · 0.63mm/px · 3 of 303 slices shown]
[im 61/303  lung]
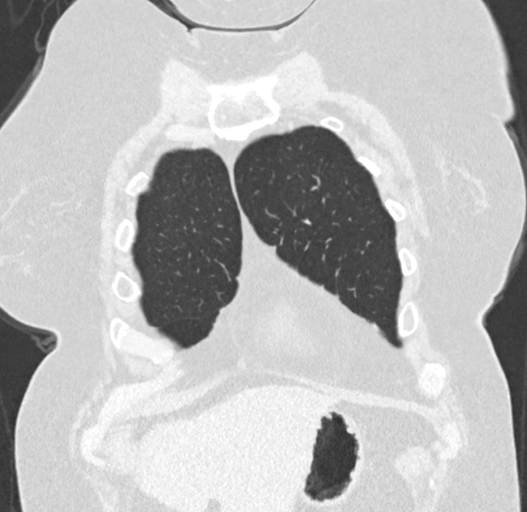
[im 121/303  lung]
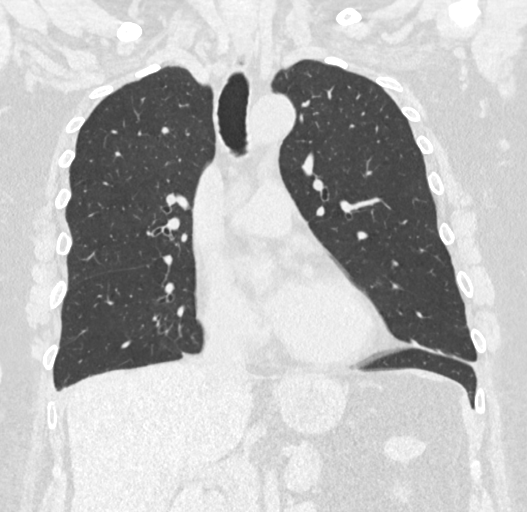
[im 182/303  lung]
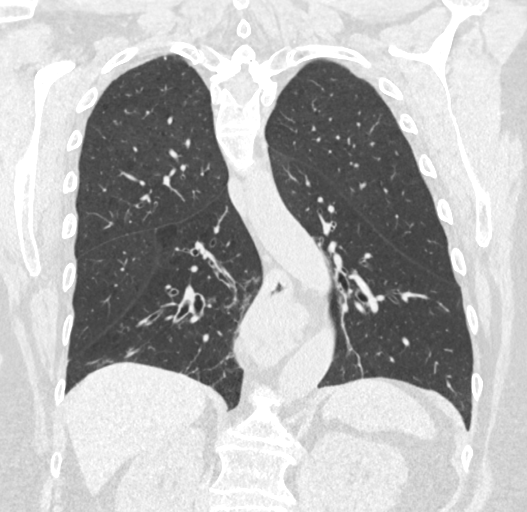

[15 of 40 positions shown; findings below may reference images not displayed]

FINDINGS: Cardiovascular: The heart is normal in size. No pericardial
effusion.

No evidence of thoracic aortic aneurysm.

Mediastinum/Nodes: No suspicious mediastinal lymphadenopathy.

Visualized thyroid is notable for a 9 mm left thyroid nodule (series
2/image 9). Not clinically significant; no follow-up imaging
recommended (ref: [HOSPITAL]. [DATE]): 143-50).

Lungs/Pleura: Mild biapical pleural-parenchymal scarring.

Mild centrilobular emphysematous changes, upper lung predominant.
Right upper lobe and bilateral lower lobe bronchiectasis.

Mild patchy opacities in the right lower lobe, improved, favoring
post infectious/inflammatory scarring.

No new/suspicious pulmonary nodules.

No pleural effusion or pneumothorax.

Upper Abdomen: Visualized upper abdomen is notable for a moderate
hiatal hernia and very mild vascular calcifications.

Musculoskeletal: Degenerative changes of the visualized
thoracolumbar spine.
IMPRESSION: Lung-RADS 2, benign appearance or behavior.

Given the clinical history, it is unclear that the patient will meet
criteria for continued lung cancer screening. As such, a follow-up
recommendation was not provided.

Emphysema ([id number]).

## 2023-08-18 ENCOUNTER — Encounter: Payer: Self-pay | Admitting: *Deleted

## 2023-08-27 ENCOUNTER — Encounter: Payer: Self-pay | Admitting: *Deleted

## 2023-08-28 ENCOUNTER — Other Ambulatory Visit: Payer: Self-pay

## 2023-08-28 ENCOUNTER — Ambulatory Visit: Payer: Medicare Other | Admitting: Anesthesiology

## 2023-08-28 ENCOUNTER — Encounter
Admission: RE | Disposition: A | Discharge status: Home / Self Care | Payer: Self-pay | Source: Home / Self Care | Attending: Gastroenterology

## 2023-08-28 ENCOUNTER — Ambulatory Visit
Admission: RE | Admit: 2023-08-28 | Discharge: 2023-08-28 | Disposition: A | Discharge status: Home / Self Care | Payer: Medicare Other | Attending: Gastroenterology | Admitting: Gastroenterology

## 2023-08-28 ENCOUNTER — Encounter: Payer: Self-pay | Admitting: *Deleted

## 2023-08-28 DIAGNOSIS — E669 Obesity, unspecified: Secondary | ICD-10-CM | POA: Diagnosis not present

## 2023-08-28 DIAGNOSIS — K319 Disease of stomach and duodenum, unspecified: Secondary | ICD-10-CM | POA: Insufficient documentation

## 2023-08-28 DIAGNOSIS — D509 Iron deficiency anemia, unspecified: Secondary | ICD-10-CM | POA: Insufficient documentation

## 2023-08-28 DIAGNOSIS — Z79899 Other long term (current) drug therapy: Secondary | ICD-10-CM | POA: Diagnosis not present

## 2023-08-28 DIAGNOSIS — Z6831 Body mass index (BMI) 31.0-31.9, adult: Secondary | ICD-10-CM | POA: Insufficient documentation

## 2023-08-28 DIAGNOSIS — K641 Second degree hemorrhoids: Secondary | ICD-10-CM | POA: Diagnosis not present

## 2023-08-28 DIAGNOSIS — K449 Diaphragmatic hernia without obstruction or gangrene: Secondary | ICD-10-CM | POA: Insufficient documentation

## 2023-08-28 DIAGNOSIS — J449 Chronic obstructive pulmonary disease, unspecified: Secondary | ICD-10-CM | POA: Diagnosis not present

## 2023-08-28 DIAGNOSIS — Z860101 Personal history of adenomatous and serrated colon polyps: Secondary | ICD-10-CM | POA: Insufficient documentation

## 2023-08-28 DIAGNOSIS — K259 Gastric ulcer, unspecified as acute or chronic, without hemorrhage or perforation: Secondary | ICD-10-CM | POA: Diagnosis not present

## 2023-08-28 DIAGNOSIS — K5730 Diverticulosis of large intestine without perforation or abscess without bleeding: Secondary | ICD-10-CM | POA: Diagnosis not present

## 2023-08-28 DIAGNOSIS — Z87891 Personal history of nicotine dependence: Secondary | ICD-10-CM | POA: Diagnosis not present

## 2023-08-28 DIAGNOSIS — J439 Emphysema, unspecified: Secondary | ICD-10-CM | POA: Insufficient documentation

## 2023-08-28 DIAGNOSIS — I10 Essential (primary) hypertension: Secondary | ICD-10-CM | POA: Diagnosis not present

## 2023-08-28 HISTORY — DX: Wedge compression fracture of first lumbar vertebra, initial encounter for closed fracture: S32.010A

## 2023-08-28 HISTORY — PX: BIOPSY: SHX5522

## 2023-08-28 HISTORY — PX: ESOPHAGOGASTRODUODENOSCOPY (EGD) WITH PROPOFOL: SHX5813

## 2023-08-28 HISTORY — DX: Unspecified asthma, uncomplicated: J45.909

## 2023-08-28 HISTORY — DX: Emphysema, unspecified: J43.9

## 2023-08-28 HISTORY — PX: COLONOSCOPY WITH PROPOFOL: SHX5780

## 2023-08-28 SURGERY — COLONOSCOPY WITH PROPOFOL
Anesthesia: General

## 2023-08-28 MED ORDER — SODIUM CHLORIDE 0.9 % IV SOLN: INTRAVENOUS | Status: DC

## 2023-08-28 MED ORDER — PROPOFOL 500 MG/50ML IV EMUL
INTRAVENOUS | Status: DC | PRN
  Administered 2023-08-28: 120 ug/kg/min via INTRAVENOUS

## 2023-08-28 MED ORDER — LIDOCAINE HCL (PF) 2 % IJ SOLN
INTRAMUSCULAR | Status: AC
  Filled 2023-08-28: qty 5

## 2023-08-28 MED ORDER — PROPOFOL 10 MG/ML IV BOLUS
INTRAVENOUS | Status: DC | PRN
  Administered 2023-08-28: 70 mg via INTRAVENOUS
  Administered 2023-08-28 (×3): 30 mg via INTRAVENOUS

## 2023-08-28 MED ORDER — PHENYLEPHRINE 80 MCG/ML (10ML) SYRINGE FOR IV PUSH (FOR BLOOD PRESSURE SUPPORT)
PREFILLED_SYRINGE | INTRAVENOUS | Status: AC
  Filled 2023-08-28: qty 10

## 2023-08-28 MED ORDER — PHENYLEPHRINE 80 MCG/ML (10ML) SYRINGE FOR IV PUSH (FOR BLOOD PRESSURE SUPPORT)
PREFILLED_SYRINGE | INTRAVENOUS | Status: DC | PRN
  Administered 2023-08-28 (×2): 80 ug via INTRAVENOUS

## 2023-08-28 MED ORDER — LIDOCAINE HCL (CARDIAC) PF 100 MG/5ML IV SOSY
PREFILLED_SYRINGE | INTRAVENOUS | Status: DC | PRN
  Administered 2023-08-28: 50 mg via INTRAVENOUS

## 2023-08-28 NOTE — Transfer of Care (Signed)
Immediate Anesthesia Transfer of Care Note  Patient: Emily Lawrence  Procedure(s) Performed: COLONOSCOPY WITH PROPOFOL ESOPHAGOGASTRODUODENOSCOPY (EGD) WITH PROPOFOL  Patient Location: PACU and Endoscopy Unit  Anesthesia Type:General  Level of Consciousness: awake  Airway & Oxygen Therapy: Patient Spontanous Breathing  Post-op Assessment: Report given to RN and Post -op Vital signs reviewed and stable  Post vital signs: Reviewed and stable  Last Vitals:  Vitals Value Taken Time  BP 92/52 08/28/23 1016  Temp 35.9 C 08/28/23 1016  Pulse 91 08/28/23 1017  Resp 27 08/28/23 1017  SpO2 100 % 08/28/23 1017  Vitals shown include unfiled device data.  Last Pain:  Vitals:   08/28/23 1016  TempSrc: Temporal  PainSc:          Complications: No notable events documented.

## 2023-08-28 NOTE — Interval H&P Note (Signed)
History and Physical Interval Note:  08/28/2023 9:41 AM  Emily Lawrence  has presented today for surgery, with the diagnosis of IDA.  The various methods of treatment have been discussed with the patient and family. After consideration of risks, benefits and other options for treatment, the patient has consented to  Procedure(s): COLONOSCOPY WITH PROPOFOL (N/A) ESOPHAGOGASTRODUODENOSCOPY (EGD) WITH PROPOFOL (N/A) as a surgical intervention.  The patient's history has been reviewed, patient examined, no change in status, stable for surgery.  I have reviewed the patient's chart and labs.  Questions were answered to the patient's satisfaction.     Regis Bill  Ok to proceed with EGD/Colonoscopy

## 2023-08-28 NOTE — Anesthesia Postprocedure Evaluation (Signed)
Anesthesia Post Note  Patient: Emily Lawrence  Procedure(s) Performed: COLONOSCOPY WITH PROPOFOL ESOPHAGOGASTRODUODENOSCOPY (EGD) WITH PROPOFOL BIOPSY  Patient location during evaluation: Endoscopy Anesthesia Type: General Level of consciousness: awake and alert Pain management: pain level controlled Vital Signs Assessment: post-procedure vital signs reviewed and stable Respiratory status: spontaneous breathing, nonlabored ventilation, respiratory function stable and patient connected to nasal cannula oxygen Cardiovascular status: blood pressure returned to baseline and stable Postop Assessment: no apparent nausea or vomiting Anesthetic complications: no  No notable events documented.   Last Vitals:  Vitals:   08/28/23 0859 08/28/23 1016  BP: (!) 146/87 (!) 92/52  Pulse: (!) 105 91  Resp: 18 18  Temp: (!) 36.3 C (!) 35.9 C  SpO2: 99% 100%    Last Pain:  Vitals:   08/28/23 1036  TempSrc:   PainSc: 0-No pain                 Stephanie Coup

## 2023-08-28 NOTE — Op Note (Addendum)
 [facility] Patient Name: Emily Lawrence Procedure Date: 08/28/2023 9:32 AM MRN: [id number] Account #: [id number] Date of Birth: 07-20-1945 Admit Type: Outpatient Age: 78 Room: [facility] ENDO ROOM 3 Gender: Female Note Status: Supervisor Override Instrument Name: Nelda Marseille [id number] Procedure:             Colonoscopy Indications:           Iron deficiency anemia, Follow-up for history of                         adenomatous polyps in the colon Providers:             Eather Colas MD, MD Referring MD:          Eather Colas MD, MD (Referring MD), Leim Fabry MD, MD (Referring MD) Medicines:             Monitored Anesthesia Care Complications:         No immediate complications. Procedure:             Pre-Anesthesia Assessment:                        - Prior to the procedure, a History and Physical was                         performed, and patient medications and allergies were                         reviewed. The patient is competent. The risks and                         benefits of the procedure and the sedation options and                         risks were discussed with the patient. All questions                         were answered and informed consent was obtained.                         Patient identification and proposed procedure were                         verified by the physician, the nurse, the                         anesthesiologist, the anesthetist and the technician                         in the endoscopy suite. Mental Status Examination:                         alert and oriented. Airway Examination: normal                         oropharyngeal airway and neck mobility. Respiratory  Examination: clear to auscultation. CV Examination:                         normal. Prophylactic Antibiotics: The patient does not                         require prophylactic antibiotics. Prior                          Anticoagulants: The patient has taken no anticoagulant                         or antiplatelet agents. ASA Grade Assessment: III - A                         patient with severe systemic disease. After reviewing                         the risks and benefits, the patient was deemed in                         satisfactory condition to undergo the procedure. The                         anesthesia plan was to use monitored anesthesia care                         (MAC). Immediately prior to administration of                         medications, the patient was re-assessed for adequacy                         to receive sedatives. The heart rate, respiratory                         rate, oxygen saturations, blood pressure, adequacy of                         pulmonary ventilation, and response to care were                         monitored throughout the procedure. The physical                         status of the patient was re-assessed after the                         procedure.                        After obtaining informed consent, the colonoscope was                         passed under direct vision. Throughout the procedure,                         the patient's blood pressure, pulse, and oxygen  saturations were monitored continuously. The                         Colonoscope was introduced through the anus and                         advanced to the the cecum, identified by appendiceal                         orifice and ileocecal valve. The colonoscopy was                         performed without difficulty. The patient tolerated                         the procedure well. The quality of the bowel                         preparation was good. The ileocecal valve, appendiceal                         orifice, and rectum were photographed. Findings:      The perianal and digital rectal examinations were normal.      Multiple small-mouthed  diverticula were found in the sigmoid colon,       descending colon, transverse colon and ascending colon.      Internal hemorrhoids were found during retroflexion. The hemorrhoids       were Grade II (internal hemorrhoids that prolapse but reduce       spontaneously).      The exam was otherwise without abnormality on direct and retroflexion       views. Impression:            - Diverticulosis in the sigmoid colon, in the                         descending colon, in the transverse colon and in the                         ascending colon.                        - Internal hemorrhoids.                        - The examination was otherwise normal on direct and                         retroflexion views.                        - No specimens collected. Recommendation:        - Discharge patient to home.                        - Resume previous diet.                        - Continue present medications.                        -  Repeat colonoscopy is not recommended due to current                         age (41 years or older) for surveillance.                        - Return to referring physician as previously                         scheduled. Procedure Code(s):     --- Professional ---                        [phone], Colonoscopy, flexible; diagnostic, including                         collection of specimen(s) by brushing or washing, when                         performed (separate procedure) Diagnosis Code(s):     --- Professional ---                        K64.1, Second degree hemorrhoids                        D50.9, Iron deficiency anemia, unspecified                        K57.30, Diverticulosis of large intestine without                         perforation or abscess without bleeding CPT copyright 2022 American [employer] Association. All rights reserved. The codes documented in this report are preliminary and upon coder review may  be revised to meet current compliance  requirements. Eather Colas MD, MD 08/28/2023 10:26:21 AM Number of Addenda: 0 Note Initiated On: 08/28/2023 9:32 AM Scope Withdrawal Time: 0 hours 7 minutes 34 seconds  Total Procedure Duration: 0 hours 15 minutes 51 seconds  Estimated Blood Loss:  Estimated blood loss: none.      [facility]

## 2023-08-28 NOTE — H&P (Signed)
Outpatient short stay form Pre-procedure 08/28/2023  Regis Bill, MD  Primary Physician: Leim Fabry, MD  Reason for visit:  IDA  History of present illness:    78 y/o lady with history of hypertension, HLD, and COPD here for EGD/Colonoscopy for IDA. Last colonoscopy in 2019 with some small polyps. No blood thinners. No family history of GI malignancies. History of hysterectomy.    Current Facility-Administered Medications:    0.9 %  sodium chloride infusion, , Intravenous, Continuous, Fatih Stalvey, Rossie Muskrat, MD, Last Rate: 20 mL/hr at 08/28/23 0918, New Bag at 08/28/23 0918  Medications Prior to Admission  Medication Sig Dispense Refill Last Dose/Taking   amLODipine (NORVASC) 10 MG tablet Take 10 mg by mouth daily.   08/28/2023 Morning   atorvastatin (LIPITOR) 10 MG tablet Take 10 mg by mouth daily.   08/28/2023 Morning   budesonide-formoterol (SYMBICORT) 80-4.5 MCG/ACT inhaler Inhale 2 puffs into the lungs 2 (two) times daily.   08/28/2023 Morning   Carboxymethylcellulose Sodium (THERATEARS OP) Apply to eye 2 (two) times daily as needed.   Past Week   cetirizine (ZYRTEC) 10 MG tablet Take 10 mg by mouth daily.   08/28/2023 Morning   cholecalciferol (VITAMIN D) 400 units TABS tablet Take 2,000 Units by mouth.   Past Week   hydrochlorothiazide (HYDRODIURIL) 25 MG tablet Take 25 mg by mouth daily.   08/28/2023 Morning   losartan (COZAAR) 100 MG tablet Take 100 mg by mouth daily.   08/28/2023 Morning   triamcinolone (NASACORT) 55 MCG/ACT AERO nasal inhaler Place 2 sprays into the nose daily.   08/28/2023 Morning   albuterol (PROVENTIL HFA;VENTOLIN HFA) 108 (90 Base) MCG/ACT inhaler Inhale into the lungs every 6 (six) hours as needed for wheezing or shortness of breath.        Allergies  Allergen Reactions   Carvedilol     Wheezing   Lisinopril Cough   Metoprolol     Dryness of mucous membranes     Past Medical History:  Diagnosis Date   Allergic rhinitis    Asthma     Basal cell carcinoma    Closed compression fracture of body of L1 vertebra (HCC)    COPD (chronic obstructive pulmonary disease) (HCC)    Hypercholesteremia    Hypertension    Osteopenia    Pulmonary emphysema (HCC)    Vitamin D deficiency     Review of systems:  Otherwise negative.    Physical Exam  Gen: Alert, oriented. Appears stated age.  HEENT: PERRLA. Lungs: No respiratory distress CV: RRR Abd: soft, benign, no masses Ext: No edema    Planned procedures: Proceed with EGD/colonoscopy. The patient understands the nature of the planned procedure, indications, risks, alternatives and potential complications including but not limited to bleeding, infection, perforation, damage to internal organs and possible oversedation/side effects from anesthesia. The patient agrees and gives consent to proceed.  Please refer to procedure notes for findings, recommendations and patient disposition/instructions.     Regis Bill, MD [facility]enter[facility]

## 2023-08-28 NOTE — Anesthesia Preprocedure Evaluation (Signed)
Anesthesia Evaluation  Patient identified by MRN, date of birth, ID band Patient awake    Reviewed: Allergy & Precautions, NPO status , Patient's Chart, lab work & pertinent test results  History of Anesthesia Complications Negative for: history of anesthetic complications  Airway Mallampati: IV  TM Distance: >3 FB Neck ROM: Full    Dental no notable dental hx. (+) Chipped, Dental Advidsory Given   Pulmonary shortness of breath and with exertion, COPD, former smoker   Pulmonary exam normal breath sounds clear to auscultation       Cardiovascular hypertension, On Medications negative cardio ROS Normal cardiovascular exam Rhythm:Regular Rate:Normal     Neuro/Psych negative neurological ROS  negative psych ROS   GI/Hepatic negative GI ROS, Neg liver ROS,,,  Endo/Other  negative endocrine ROS  Obesity   Renal/GU negative Renal ROS  negative genitourinary   Musculoskeletal   Abdominal   Peds  Hematology negative hematology ROS (+) Skin BCC   Anesthesia Other Findings Past Medical History: No date: Allergic rhinitis No date: Asthma No date: Basal cell carcinoma No date: Closed compression fracture of body of L1 vertebra (HCC) No date: COPD (chronic obstructive pulmonary disease) (HCC) No date: Hypercholesteremia No date: Hypertension No date: Osteopenia No date: Pulmonary emphysema (HCC) No date: Vitamin D deficiency  Past Surgical History: No date: ABDOMINAL HYSTERECTOMY No date: BUNIONECTOMY 12/18/2020: CATARACT EXTRACTION W/PHACO; Left     Comment:  Procedure: CATARACT EXTRACTION PHACO AND INTRAOCULAR               LENS PLACEMENT (IOC) LEFT;  Surgeon: Galen Manila,               MD;  Location: [facility];  Service:               Ophthalmology;  Laterality: Left;  12.95 01:09.0 01/01/2021: CATARACT EXTRACTION W/PHACO; Right     Comment:  Procedure: CATARACT EXTRACTION PHACO AND INTRAOCULAR                LENS PLACEMENT (IOC) RIGHT 16.14 01:19.1;  Surgeon:               Galen Manila, MD;  Location: [facility];                Service: Ophthalmology;  Laterality: Right; 01/15/2018: COLONOSCOPY WITH PROPOFOL; N/A     Comment:  Procedure: COLONOSCOPY WITH PROPOFOL;  Surgeon: Scot Jun, MD;  Location: [facility] END[facility];  Service:               Endoscopy;  Laterality: N/A; No date: EYE SURGERY No date: PILONIDAL CYST EXCISION  BMI    Body Mass Index: 31.93 kg/m      Reproductive/Obstetrics negative OB ROS                             Anesthesia Physical Anesthesia Plan  ASA: 3  Anesthesia Plan: General   Post-op Pain Management: Minimal or no pain anticipated   Induction: Intravenous  PONV Risk Score and Plan: 3 and Propofol infusion, TIVA and Ondansetron  Airway Management Planned: Nasal Cannula  Additional Equipment: None  Intra-op Plan:   Post-operative Plan:   Informed Consent: I have reviewed the patients History and Physical, chart, labs and discussed the procedure including the risks, benefits and alternatives for the proposed anesthesia with the patient or authorized representative who  has indicated his/her understanding and acceptance.     Dental advisory given  Plan Discussed with: CRNA and Surgeon  Anesthesia Plan Comments: (Discussed risks of anesthesia with patient, including possibility of difficulty with spontaneous ventilation under anesthesia necessitating airway intervention, PONV, and rare risks such as cardiac or respiratory or neurological events, and allergic reactions. Discussed the role of CRNA in patient's perioperative care. Patient understands.)       Anesthesia Quick Evaluation

## 2023-08-28 NOTE — Op Note (Signed)
[facility] Patient Name: Emily Lawrence Procedure Date: 08/28/2023 9:32 AM MRN: [id number] Account #: [id number] Date of Birth: 1946/03/16 Admit Type: Outpatient Age: 78 Room: [facility] ENDO ROOM 3 Gender: Female Note Status: Finalized Instrument Name: Upper Endoscope [id number] Procedure:             Upper GI endoscopy Indications:           Iron deficiency anemia Providers:             Eather Colas MD, MD Referring MD:          Eather Colas MD, MD (Referring MD), Leim Fabry MD, MD (Referring MD) Medicines:             Monitored Anesthesia Care Complications:         No immediate complications. Estimated blood loss:                         Minimal. Procedure:             Pre-Anesthesia Assessment:                        - Prior to the procedure, a History and Physical was                         performed, and patient medications and allergies were                         reviewed. The patient is competent. The risks and                         benefits of the procedure and the sedation options and                         risks were discussed with the patient. All questions                         were answered and informed consent was obtained.                         Patient identification and proposed procedure were                         verified by the physician, the nurse, the                         anesthesiologist, the anesthetist and the technician                         in the endoscopy suite. Mental Status Examination:                         alert and oriented. Airway Examination: normal                         oropharyngeal airway and neck mobility. Respiratory  Examination: clear to auscultation. CV Examination:                         normal. Prophylactic Antibiotics: The patient does not                         require prophylactic antibiotics. Prior                          Anticoagulants: The patient has taken no anticoagulant                         or antiplatelet agents. ASA Grade Assessment: III - A                         patient with severe systemic disease. After reviewing                         the risks and benefits, the patient was deemed in                         satisfactory condition to undergo the procedure. The                         anesthesia plan was to use monitored anesthesia care                         (MAC). Immediately prior to administration of                         medications, the patient was re-assessed for adequacy                         to receive sedatives. The heart rate, respiratory                         rate, oxygen saturations, blood pressure, adequacy of                         pulmonary ventilation, and response to care were                         monitored throughout the procedure. The physical                         status of the patient was re-assessed after the                         procedure.                        After obtaining informed consent, the endoscope was                         passed under direct vision. Throughout the procedure,                         the patient's blood pressure, pulse, and oxygen  saturations were monitored continuously. The Endoscope                         was introduced through the mouth, and advanced to the                         second part of duodenum. The upper GI endoscopy was                         accomplished without difficulty. The patient tolerated                         the procedure well. Findings:      The examined esophagus was normal.      A large hiatal hernia was present.      One non-bleeding superficial gastric ulcer with a clean ulcer base       (Forrest Class III) was found in the gastric body. The lesion was 4 mm       in largest dimension. Biopsies were taken with a cold forceps for       Helicobacter pylori testing.  Estimated blood loss was minimal.      The examined duodenum was normal. Impression:            - Normal esophagus.                        - Large hiatal hernia.                        - Non-bleeding gastric ulcer with a clean ulcer base                         (Forrest Class III). Biopsied.                        - Normal examined duodenum. Recommendation:        - Discharge patient to home.                        - Resume previous diet.                        - Continue present medications.                        - Await pathology results.                        - Use a proton pump inhibitor PO BID. Procedure Code(s):     --- Professional ---                        [phone], Esophagogastroduodenoscopy, flexible,                         transoral; with biopsy, single or multiple Diagnosis Code(s):     --- Professional ---                        K44.9, Diaphragmatic hernia without obstruction or  gangrene                        K25.9, Gastric ulcer, unspecified as acute or chronic,                         without hemorrhage or perforation                        D50.9, Iron deficiency anemia, unspecified CPT copyright 2022 American Medical Association. All rights reserved. The codes documented in this report are preliminary and upon coder review may  be revised to meet current compliance requirements. Eather Colas MD, MD 08/28/2023 10:23:29 AM Number of Addenda: 0 Note Initiated On: 08/28/2023 9:32 AM Estimated Blood Loss:  Estimated blood loss was minimal.      [facility]

## 2023-08-31 ENCOUNTER — Encounter: Payer: Self-pay | Admitting: Gastroenterology

## 2023-08-31 LAB — SURGICAL PATHOLOGY
